# Patient Record
Sex: Female | Born: 1975 | Race: Black or African American | Hispanic: No | Marital: Single | Smoking: Former smoker
Health system: Southern US, Community
[De-identification: ages and names within clinical notes are randomized; demographics above are authoritative.]

## PROBLEM LIST (undated history)

## (undated) ENCOUNTER — Inpatient Hospital Stay (HOSPITAL_COMMUNITY): Payer: Self-pay

## (undated) DIAGNOSIS — R51 Headache: Secondary | ICD-10-CM

## (undated) DIAGNOSIS — F319 Bipolar disorder, unspecified: Secondary | ICD-10-CM

## (undated) DIAGNOSIS — M1990 Unspecified osteoarthritis, unspecified site: Secondary | ICD-10-CM

## (undated) DIAGNOSIS — H209 Unspecified iridocyclitis: Secondary | ICD-10-CM

## (undated) DIAGNOSIS — F419 Anxiety disorder, unspecified: Secondary | ICD-10-CM

## (undated) DIAGNOSIS — J45909 Unspecified asthma, uncomplicated: Secondary | ICD-10-CM

## (undated) DIAGNOSIS — I10 Essential (primary) hypertension: Secondary | ICD-10-CM

## (undated) DIAGNOSIS — F329 Major depressive disorder, single episode, unspecified: Secondary | ICD-10-CM

## (undated) DIAGNOSIS — D649 Anemia, unspecified: Secondary | ICD-10-CM

## (undated) DIAGNOSIS — E079 Disorder of thyroid, unspecified: Secondary | ICD-10-CM

## (undated) DIAGNOSIS — F32A Depression, unspecified: Secondary | ICD-10-CM

## (undated) HISTORY — DX: Depression, unspecified: F32.A

## (undated) HISTORY — DX: Major depressive disorder, single episode, unspecified: F32.9

## (undated) HISTORY — DX: Essential (primary) hypertension: I10

## (undated) HISTORY — DX: Unspecified osteoarthritis, unspecified site: M19.90

## (undated) HISTORY — PX: OTHER SURGICAL HISTORY: SHX169

## (undated) HISTORY — DX: Anxiety disorder, unspecified: F41.9

## (undated) HISTORY — PX: FRACTURE SURGERY: SHX138

## (undated) HISTORY — PX: ELBOW SURGERY: SHX618

---

## 1998-07-17 ENCOUNTER — Other Ambulatory Visit: Admission: RE | Admit: 1998-07-17 | Discharge: 1998-07-17 | Payer: Self-pay | Admitting: *Deleted

## 1998-12-07 ENCOUNTER — Inpatient Hospital Stay (HOSPITAL_COMMUNITY): Admission: AD | Admit: 1998-12-07 | Discharge: 1998-12-07 | Payer: Self-pay | Admitting: *Deleted

## 1998-12-10 ENCOUNTER — Observation Stay (HOSPITAL_COMMUNITY): Admission: AD | Admit: 1998-12-10 | Discharge: 1998-12-10 | Payer: Self-pay | Admitting: *Deleted

## 1999-01-04 ENCOUNTER — Other Ambulatory Visit: Admission: RE | Admit: 1999-01-04 | Discharge: 1999-01-04 | Payer: Self-pay | Admitting: *Deleted

## 1999-02-11 ENCOUNTER — Emergency Department (HOSPITAL_COMMUNITY): Admission: EM | Admit: 1999-02-11 | Discharge: 1999-02-11 | Payer: Self-pay | Admitting: Emergency Medicine

## 1999-09-22 ENCOUNTER — Emergency Department (HOSPITAL_COMMUNITY): Admission: EM | Admit: 1999-09-22 | Discharge: 1999-09-22 | Payer: Self-pay | Admitting: *Deleted

## 1999-11-20 ENCOUNTER — Emergency Department (HOSPITAL_COMMUNITY): Admission: EM | Admit: 1999-11-20 | Discharge: 1999-11-20 | Payer: Self-pay | Admitting: Emergency Medicine

## 1999-11-21 ENCOUNTER — Emergency Department (HOSPITAL_COMMUNITY): Admission: EM | Admit: 1999-11-21 | Discharge: 1999-11-21 | Payer: Self-pay | Admitting: Emergency Medicine

## 2000-09-15 ENCOUNTER — Emergency Department (HOSPITAL_COMMUNITY): Admission: EM | Admit: 2000-09-15 | Discharge: 2000-09-15 | Payer: Self-pay | Admitting: Internal Medicine

## 2000-09-18 ENCOUNTER — Emergency Department (HOSPITAL_COMMUNITY): Admission: EM | Admit: 2000-09-18 | Discharge: 2000-09-18 | Payer: Self-pay | Admitting: Emergency Medicine

## 2000-11-27 ENCOUNTER — Emergency Department (HOSPITAL_COMMUNITY): Admission: EM | Admit: 2000-11-27 | Discharge: 2000-11-27 | Payer: Self-pay | Admitting: Emergency Medicine

## 2001-01-29 ENCOUNTER — Emergency Department (HOSPITAL_COMMUNITY): Admission: EM | Admit: 2001-01-29 | Discharge: 2001-01-29 | Payer: Self-pay | Admitting: Emergency Medicine

## 2001-01-30 ENCOUNTER — Emergency Department (HOSPITAL_COMMUNITY): Admission: EM | Admit: 2001-01-30 | Discharge: 2001-01-30 | Payer: Self-pay | Admitting: Emergency Medicine

## 2001-01-30 ENCOUNTER — Encounter: Payer: Self-pay | Admitting: Emergency Medicine

## 2001-02-01 ENCOUNTER — Emergency Department (HOSPITAL_COMMUNITY): Admission: EM | Admit: 2001-02-01 | Discharge: 2001-02-01 | Payer: Self-pay | Admitting: Emergency Medicine

## 2001-04-10 ENCOUNTER — Emergency Department (HOSPITAL_COMMUNITY): Admission: EM | Admit: 2001-04-10 | Discharge: 2001-04-10 | Payer: Self-pay | Admitting: Emergency Medicine

## 2001-08-24 ENCOUNTER — Emergency Department (HOSPITAL_COMMUNITY): Admission: EM | Admit: 2001-08-24 | Discharge: 2001-08-24 | Payer: Self-pay | Admitting: Emergency Medicine

## 2001-10-24 ENCOUNTER — Emergency Department (HOSPITAL_COMMUNITY): Admission: EM | Admit: 2001-10-24 | Discharge: 2001-10-24 | Payer: Self-pay | Admitting: Emergency Medicine

## 2002-05-08 ENCOUNTER — Emergency Department (HOSPITAL_COMMUNITY): Admission: EM | Admit: 2002-05-08 | Discharge: 2002-05-08 | Payer: Self-pay | Admitting: Emergency Medicine

## 2002-06-01 ENCOUNTER — Emergency Department (HOSPITAL_COMMUNITY): Admission: EM | Admit: 2002-06-01 | Discharge: 2002-06-01 | Payer: Self-pay | Admitting: Emergency Medicine

## 2002-06-01 ENCOUNTER — Encounter: Payer: Self-pay | Admitting: Emergency Medicine

## 2003-01-30 ENCOUNTER — Other Ambulatory Visit: Admission: RE | Admit: 2003-01-30 | Discharge: 2003-01-30 | Payer: Self-pay | Admitting: *Deleted

## 2003-02-17 ENCOUNTER — Encounter: Payer: Self-pay | Admitting: Emergency Medicine

## 2003-02-17 ENCOUNTER — Emergency Department (HOSPITAL_COMMUNITY): Admission: EM | Admit: 2003-02-17 | Discharge: 2003-02-17 | Payer: Self-pay | Admitting: Emergency Medicine

## 2003-08-11 ENCOUNTER — Emergency Department (HOSPITAL_COMMUNITY): Admission: EM | Admit: 2003-08-11 | Discharge: 2003-08-11 | Payer: Self-pay | Admitting: Emergency Medicine

## 2003-12-08 ENCOUNTER — Other Ambulatory Visit: Admission: RE | Admit: 2003-12-08 | Discharge: 2003-12-08 | Payer: Self-pay | Admitting: Obstetrics and Gynecology

## 2003-12-26 ENCOUNTER — Emergency Department (HOSPITAL_COMMUNITY): Admission: EM | Admit: 2003-12-26 | Discharge: 2003-12-27 | Payer: Self-pay | Admitting: Emergency Medicine

## 2004-05-09 ENCOUNTER — Emergency Department (HOSPITAL_COMMUNITY): Admission: EM | Admit: 2004-05-09 | Discharge: 2004-05-09 | Payer: Self-pay | Admitting: Emergency Medicine

## 2004-05-13 ENCOUNTER — Inpatient Hospital Stay (HOSPITAL_COMMUNITY): Admission: AD | Admit: 2004-05-13 | Discharge: 2004-05-13 | Payer: Self-pay | Admitting: Obstetrics

## 2004-06-24 ENCOUNTER — Emergency Department (HOSPITAL_COMMUNITY): Admission: EM | Admit: 2004-06-24 | Discharge: 2004-06-24 | Payer: Self-pay | Admitting: Emergency Medicine

## 2004-06-26 ENCOUNTER — Emergency Department (HOSPITAL_COMMUNITY): Admission: EM | Admit: 2004-06-26 | Discharge: 2004-06-26 | Payer: Self-pay | Admitting: Emergency Medicine

## 2004-11-10 ENCOUNTER — Emergency Department (HOSPITAL_COMMUNITY): Admission: EM | Admit: 2004-11-10 | Discharge: 2004-11-10 | Payer: Self-pay | Admitting: Emergency Medicine

## 2005-02-03 ENCOUNTER — Emergency Department (HOSPITAL_COMMUNITY): Admission: EM | Admit: 2005-02-03 | Discharge: 2005-02-03 | Payer: Self-pay | Admitting: Emergency Medicine

## 2005-09-14 ENCOUNTER — Emergency Department (HOSPITAL_COMMUNITY): Admission: EM | Admit: 2005-09-14 | Discharge: 2005-09-14 | Payer: Self-pay | Admitting: Emergency Medicine

## 2005-11-03 ENCOUNTER — Emergency Department (HOSPITAL_COMMUNITY): Admission: EM | Admit: 2005-11-03 | Discharge: 2005-11-03 | Payer: Self-pay | Admitting: Emergency Medicine

## 2005-11-06 ENCOUNTER — Emergency Department (HOSPITAL_COMMUNITY): Admission: EM | Admit: 2005-11-06 | Discharge: 2005-11-06 | Payer: Self-pay | Admitting: Emergency Medicine

## 2006-05-22 ENCOUNTER — Emergency Department (HOSPITAL_COMMUNITY): Admission: EM | Admit: 2006-05-22 | Discharge: 2006-05-22 | Payer: Self-pay | Admitting: Family Medicine

## 2006-07-15 ENCOUNTER — Emergency Department (HOSPITAL_COMMUNITY): Admission: EM | Admit: 2006-07-15 | Discharge: 2006-07-15 | Payer: Self-pay | Admitting: Emergency Medicine

## 2006-08-31 ENCOUNTER — Emergency Department (HOSPITAL_COMMUNITY): Admission: EM | Admit: 2006-08-31 | Discharge: 2006-08-31 | Payer: Self-pay | Admitting: Emergency Medicine

## 2006-09-14 ENCOUNTER — Other Ambulatory Visit (HOSPITAL_COMMUNITY): Admission: RE | Admit: 2006-09-14 | Discharge: 2006-12-13 | Payer: Self-pay | Admitting: Psychiatry

## 2006-09-14 ENCOUNTER — Ambulatory Visit: Payer: Self-pay | Admitting: Psychiatry

## 2006-12-24 ENCOUNTER — Emergency Department (HOSPITAL_COMMUNITY): Admission: EM | Admit: 2006-12-24 | Discharge: 2006-12-24 | Payer: Self-pay | Admitting: Family Medicine

## 2007-01-19 ENCOUNTER — Ambulatory Visit: Payer: Self-pay | Admitting: Psychiatry

## 2007-01-19 ENCOUNTER — Inpatient Hospital Stay (HOSPITAL_COMMUNITY): Admission: AD | Admit: 2007-01-19 | Discharge: 2007-01-22 | Payer: Self-pay | Admitting: Psychiatry

## 2007-01-27 ENCOUNTER — Encounter: Admission: RE | Admit: 2007-01-27 | Discharge: 2007-01-27 | Payer: Self-pay | Admitting: Obstetrics and Gynecology

## 2007-07-08 ENCOUNTER — Emergency Department (HOSPITAL_COMMUNITY): Admission: EM | Admit: 2007-07-08 | Discharge: 2007-07-08 | Payer: Self-pay | Admitting: Emergency Medicine

## 2008-04-26 ENCOUNTER — Emergency Department (HOSPITAL_COMMUNITY): Admission: EM | Admit: 2008-04-26 | Discharge: 2008-04-26 | Payer: Self-pay | Admitting: Emergency Medicine

## 2008-07-16 ENCOUNTER — Emergency Department (HOSPITAL_COMMUNITY): Admission: EM | Admit: 2008-07-16 | Discharge: 2008-07-16 | Payer: Self-pay | Admitting: Emergency Medicine

## 2008-08-25 ENCOUNTER — Emergency Department (HOSPITAL_COMMUNITY): Admission: EM | Admit: 2008-08-25 | Discharge: 2008-08-25 | Payer: Self-pay | Admitting: Emergency Medicine

## 2008-10-28 ENCOUNTER — Emergency Department (HOSPITAL_COMMUNITY): Admission: EM | Admit: 2008-10-28 | Discharge: 2008-10-28 | Payer: Self-pay | Admitting: Emergency Medicine

## 2008-11-21 ENCOUNTER — Emergency Department (HOSPITAL_COMMUNITY): Admission: EM | Admit: 2008-11-21 | Discharge: 2008-11-21 | Payer: Self-pay | Admitting: Emergency Medicine

## 2008-12-11 ENCOUNTER — Encounter: Admission: RE | Admit: 2008-12-11 | Discharge: 2008-12-11 | Payer: Self-pay | Admitting: Family Medicine

## 2009-02-21 ENCOUNTER — Emergency Department (HOSPITAL_COMMUNITY): Admission: EM | Admit: 2009-02-21 | Discharge: 2009-02-21 | Payer: Self-pay | Admitting: Emergency Medicine

## 2010-10-24 ENCOUNTER — Emergency Department (HOSPITAL_COMMUNITY)
Admission: EM | Admit: 2010-10-24 | Discharge: 2010-10-24 | Payer: Self-pay | Source: Home / Self Care | Admitting: Emergency Medicine

## 2011-03-21 NOTE — Discharge Summary (Signed)
Patricia Arnold NO.:  [id number]   MEDICAL RECORD NO.:  [id number]          PATIENT TYPE:  IPS   LOCATION:  0303                          FACILITY:  [facility]   PHYSICIAN:  Anselm Jungling, MD  DATE OF BIRTH:  January 10, 1976   DATE OF ADMISSION:  01/19/2007  DATE OF DISCHARGE:  01/22/2007                               DISCHARGE SUMMARY   IDENTIFYING DATA AND REASON FOR ADMISSION:  This was an inpatient  psychiatric admission for Patricia Arnold, a 35 year old single African American  female, employed, and a single mother of one.  She was admitted for  depressive symptoms and suicidal thoughts and wanting to kill myself.  She described her primary stressor as being her parents deteriorating  marriage, and the patient being put in the middle of her parents  conflicts.  She did not have any prior history of suicide attempts, and  upon admission denied any plan or intent for suicide.  She had not been  involved in any mental health treatment.  She had no history of drug or  alcohol abuse.  Her sleeping and eating had been diminished.  Please  refer to the admission note for further details pertaining to the  symptoms, circumstances and history that led to her hospitalization.  She was given an initial Axis I diagnosis of major depressive episode  versus adjustment disorder with depressed mood.   MEDICAL AND LABORATORY:  The patient was medically and physically  assessed by the psychiatric nurse practitioner.  She did not have any  active or chronic medical problems.   HOSPITAL COURSE:  The patient was admitted to the adult inpatient  psychiatric service.  She presented as a well-nourished, well-developed  female who was alert, fully oriented, and intelligent.  Her mood was  depressed with sad affect.  There were no signs or symptoms of psychosis  or thought disorder.  She convincingly denied any suicidal ideation or  intent or plan, and verbalized a strong desire for  help.   She agreed to a trial of Lexapro 10 mg daily, and this was well  tolerated, except for some mild initial nausea and periods of feeling  hot.   She participated in various therapeutic groups and activities and was a  good participant in the treatment program.   On the fourth hospital day there was a family session involving the  patient, her mother and sister.  In that meeting she stated again that  she was no longer suicidal.  They discussed a lot of the difficulties  that Patricia Arnold had been experiencing within the family.  They discussed  aftercare needs including psychiatrist for medication management and  individual counseling.  Her family members were very supportive.  The  patient was discharged following this meeting.  The patient agreed to  the following aftercare plan.   AFTERCARE:  The patient was discharged with a plan to follow-up with Mr.  Arbutus Ped on 01/25/2007, the day of discharge for individual  counseling.  She was also referred for management of her response to  Lexapro 10 mg daily.   The patient identified a small firm  lump in the surface skin of one of  her breasts, and was referred to the [facility] in  [place], for an appointment on 01/27/07 at 2:30 p.m.   DISCHARGE DIAGNOSES:  AXIS I: Adjustment disorder with depressed mood,  and rule out major depressive episode.  AXIS II: Deferred.  AXIS III: Breast lump, unknown etiology.  AXIS IV: Stressors severe.  AXIS V: GAF on discharge 65.      Anselm Jungling, MD  Electronically Signed     SPB/MEDQ  D:  01/25/2007  T:  01/25/2007  Job:  [id number]

## 2011-04-11 ENCOUNTER — Emergency Department (HOSPITAL_COMMUNITY): Payer: BC Managed Care – PPO

## 2011-04-11 ENCOUNTER — Emergency Department (HOSPITAL_COMMUNITY)
Admission: EM | Admit: 2011-04-11 | Discharge: 2011-04-11 | Disposition: A | Discharge status: Home / Self Care | Payer: BC Managed Care – PPO | Attending: Emergency Medicine | Admitting: Emergency Medicine

## 2011-04-11 DIAGNOSIS — D573 Sickle-cell trait: Secondary | ICD-10-CM | POA: Insufficient documentation

## 2011-04-11 DIAGNOSIS — W1809XA Striking against other object with subsequent fall, initial encounter: Secondary | ICD-10-CM | POA: Insufficient documentation

## 2011-04-11 DIAGNOSIS — R51 Headache: Secondary | ICD-10-CM | POA: Insufficient documentation

## 2011-04-11 LAB — POCT PREGNANCY, URINE: Preg Test, Ur: NEGATIVE

## 2011-08-01 ENCOUNTER — Emergency Department (HOSPITAL_COMMUNITY)
Admission: EM | Admit: 2011-08-01 | Discharge: 2011-08-01 | Disposition: A | Discharge status: Home / Self Care | Payer: BC Managed Care – PPO | Attending: Emergency Medicine | Admitting: Emergency Medicine

## 2011-08-01 DIAGNOSIS — D573 Sickle-cell trait: Secondary | ICD-10-CM | POA: Insufficient documentation

## 2011-08-01 DIAGNOSIS — R110 Nausea: Secondary | ICD-10-CM | POA: Insufficient documentation

## 2011-08-01 DIAGNOSIS — R209 Unspecified disturbances of skin sensation: Secondary | ICD-10-CM | POA: Insufficient documentation

## 2011-08-01 DIAGNOSIS — R51 Headache: Secondary | ICD-10-CM | POA: Insufficient documentation

## 2011-08-23 ENCOUNTER — Emergency Department (HOSPITAL_COMMUNITY)
Admission: EM | Admit: 2011-08-23 | Discharge: 2011-08-23 | Disposition: A | Discharge status: Home / Self Care | Payer: BC Managed Care – PPO | Attending: Emergency Medicine | Admitting: Emergency Medicine

## 2011-08-23 DIAGNOSIS — H53149 Visual discomfort, unspecified: Secondary | ICD-10-CM | POA: Insufficient documentation

## 2011-08-23 DIAGNOSIS — Z79899 Other long term (current) drug therapy: Secondary | ICD-10-CM | POA: Insufficient documentation

## 2011-08-23 DIAGNOSIS — T481X5A Adverse effect of skeletal muscle relaxants [neuromuscular blocking agents], initial encounter: Secondary | ICD-10-CM | POA: Insufficient documentation

## 2011-08-23 DIAGNOSIS — R51 Headache: Secondary | ICD-10-CM | POA: Insufficient documentation

## 2011-08-23 DIAGNOSIS — F411 Generalized anxiety disorder: Secondary | ICD-10-CM | POA: Insufficient documentation

## 2011-08-23 DIAGNOSIS — D573 Sickle-cell trait: Secondary | ICD-10-CM | POA: Insufficient documentation

## 2011-08-23 DIAGNOSIS — L299 Pruritus, unspecified: Secondary | ICD-10-CM | POA: Insufficient documentation

## 2011-08-23 DIAGNOSIS — R209 Unspecified disturbances of skin sensation: Secondary | ICD-10-CM | POA: Insufficient documentation

## 2011-08-27 ENCOUNTER — Emergency Department (HOSPITAL_COMMUNITY): Payer: BC Managed Care – PPO

## 2011-08-27 ENCOUNTER — Emergency Department (HOSPITAL_COMMUNITY)
Admission: EM | Admit: 2011-08-27 | Discharge: 2011-08-27 | Disposition: A | Discharge status: Home / Self Care | Payer: BC Managed Care – PPO | Attending: Emergency Medicine | Admitting: Emergency Medicine

## 2011-08-27 DIAGNOSIS — Z79899 Other long term (current) drug therapy: Secondary | ICD-10-CM | POA: Insufficient documentation

## 2011-08-27 DIAGNOSIS — R0989 Other specified symptoms and signs involving the circulatory and respiratory systems: Secondary | ICD-10-CM | POA: Insufficient documentation

## 2011-08-27 DIAGNOSIS — L851 Acquired keratosis [keratoderma] palmaris et plantaris: Secondary | ICD-10-CM | POA: Insufficient documentation

## 2011-08-27 DIAGNOSIS — I10 Essential (primary) hypertension: Secondary | ICD-10-CM | POA: Insufficient documentation

## 2011-08-27 DIAGNOSIS — R0609 Other forms of dyspnea: Secondary | ICD-10-CM | POA: Insufficient documentation

## 2011-08-27 DIAGNOSIS — R0789 Other chest pain: Secondary | ICD-10-CM | POA: Insufficient documentation

## 2011-08-27 DIAGNOSIS — L298 Other pruritus: Secondary | ICD-10-CM | POA: Insufficient documentation

## 2011-08-27 DIAGNOSIS — L2989 Other pruritus: Secondary | ICD-10-CM | POA: Insufficient documentation

## 2011-08-27 LAB — CBC
MCH: 30.2 pg (ref 26.0–34.0)
MCHC: 36.1 g/dL — ABNORMAL HIGH (ref 30.0–36.0)
Platelets: 264 10*3/uL (ref 150–400)
RDW: 12.2 % (ref 11.5–15.5)

## 2011-08-27 LAB — BASIC METABOLIC PANEL
BUN: 11 mg/dL (ref 6–23)
Calcium: 9.7 mg/dL (ref 8.4–10.5)
Creatinine, Ser: 0.63 mg/dL (ref 0.50–1.10)
GFR calc Af Amer: >90 mL/min (ref >90)
GFR calc non Af Amer: >90 mL/min (ref >90)

## 2011-08-27 LAB — POCT I-STAT TROPONIN I: Troponin i, poc: 0.01 ng/mL (ref 0.00–0.08)

## 2011-08-27 LAB — DIFFERENTIAL
Basophils Relative: 0 % (ref 0–1)
Eosinophils Absolute: 0 10*3/uL (ref 0.0–0.7)
Monocytes Absolute: 0.9 10*3/uL (ref 0.1–1.0)
Monocytes Relative: 6 % (ref 3–12)

## 2012-03-08 ENCOUNTER — Emergency Department (HOSPITAL_COMMUNITY)
Admission: EM | Admit: 2012-03-08 | Discharge: 2012-03-08 | Disposition: A | Discharge status: Home / Self Care | Payer: BC Managed Care – PPO | Attending: Emergency Medicine | Admitting: Emergency Medicine

## 2012-03-08 ENCOUNTER — Encounter (HOSPITAL_COMMUNITY): Payer: Self-pay | Admitting: Emergency Medicine

## 2012-03-08 DIAGNOSIS — R6889 Other general symptoms and signs: Secondary | ICD-10-CM | POA: Insufficient documentation

## 2012-03-08 DIAGNOSIS — R070 Pain in throat: Secondary | ICD-10-CM | POA: Insufficient documentation

## 2012-03-08 DIAGNOSIS — J3489 Other specified disorders of nose and nasal sinuses: Secondary | ICD-10-CM | POA: Insufficient documentation

## 2012-03-08 DIAGNOSIS — H9209 Otalgia, unspecified ear: Secondary | ICD-10-CM | POA: Insufficient documentation

## 2012-03-08 DIAGNOSIS — J069 Acute upper respiratory infection, unspecified: Secondary | ICD-10-CM | POA: Insufficient documentation

## 2012-03-08 HISTORY — DX: Anemia, unspecified: D64.9

## 2012-03-08 MED ORDER — MAGIC MOUTHWASH W/LIDOCAINE
5.0000 mL | ORAL | Status: AC
  Administered 2012-03-08: 5 mL via ORAL
  Filled 2012-03-08: qty 5

## 2012-03-08 MED ORDER — MAGIC MOUTHWASH W/LIDOCAINE: 5.0000 mL | Freq: Three times a day (TID) | ORAL | Status: DC | PRN

## 2012-03-08 NOTE — Discharge Instructions (Signed)

## 2012-03-08 NOTE — ED Notes (Signed)
Sore throat woke up this am left ear swollen migrain and back pain

## 2012-03-08 NOTE — ED Provider Notes (Signed)
History     CSN: [id number]  Arrival date & time 03/08/12  0918   First MD Initiated Contact with Patient 03/08/12 [phone]      Chief Complaint  Patient presents with  . Sore Throat    (Consider location/radiation/quality/duration/timing/severity/associated sxs/prior treatment) HPI  36 year old female presents to the ER with cold symptoms. Patient states she woke up this morning experiencing sneezing, runny nose, sore throat, pain to her left ear is sensitive to the touch. States for sore throat bothers her the most.  Patient also has persistent sinus affecting primary to the left side. She denies light and sound sensitivity. Patient denies fever, chills, chest pain, shortness of breath, abdominal pain, nausea, vomiting, diarrhea, or rash. She denies any recent sick contact. She denies any change in her voice. Patient has not tried to alleviate symptoms.  Past Medical History  Diagnosis Date  . Anemia     No past surgical history on file.  No family history on file.  History  Substance Use Topics  . Smoking status: Never Smoker   . Smokeless tobacco: Not on file  . Alcohol Use: No    OB History    Grav Para Term Preterm Abortions TAB SAB Ect Mult Living                  Review of Systems  All other systems reviewed and are negative.    Allergies  Penicillins; Sulfa antibiotics; Tizanidine; and Tramadol  Home Medications   Current Outpatient Rx  Name Route Sig Dispense Refill  . PREDNISOLONE ACETATE 1 % OP SUSP Both Eyes Place 1 drop into both eyes every other day.      BP 103/64  Pulse 80  Temp(Src) 98.7 F (37.1 C) (Oral)  Resp 18  SpO2 98%  Physical Exam  Nursing note and vitals reviewed. Constitutional: She appears well-developed and well-nourished. No distress.       Awake, alert, nontoxic appearance  HENT:  Head: Atraumatic.  Right Ear: Hearing, tympanic membrane and external ear normal.  Left Ear: Hearing, tympanic membrane and external ear  normal.  Nose: Rhinorrhea present. No nasal deformity.  Mouth/Throat: Uvula is midline and mucous membranes are normal. No oropharyngeal exudate, posterior oropharyngeal edema, posterior oropharyngeal erythema or tonsillar abscesses.  Eyes: Conjunctivae are normal. Right eye exhibits no discharge. Left eye exhibits no discharge.  Neck: Neck supple.  Cardiovascular: Normal rate and regular rhythm.   Pulmonary/Chest: Effort normal. No respiratory distress. She exhibits no tenderness.  Abdominal: Soft. There is no tenderness. There is no rebound.  Musculoskeletal: She exhibits no tenderness.       ROM appears intact  Neurological:       Mental status and motor strength appears intact  Skin: No rash noted.  Psychiatric: She has a normal mood and affect.    ED Course  Procedures (including critical care time)  Labs Reviewed - No data to display No results found.   No diagnosis found.  Results for orders placed during the hospital encounter of 03/08/12  RAPID STREP SCREEN      Component Value Range   Streptococcus, Group A Screen (Direct) NEGATIVE  NEGATIVE    No results found.    MDM  Patient in with cold symptoms suggestive of URI. Tenderness to L ear on palpation without evidence of infection.  Doubt otitis externa.  Throat exam unremarkable. No evidence of PTA or ludwig's angina.  No voice changes, no trismus.  Pt is afebrile, VSS.  Rapid strep  test ordered.  Magic mouthwash given.     10:39 AM Strep test negative.  Will d/c with magic mouthwash for sxs treatment.  Pt voice understanding and agrees with plan.      Fayrene Helper, PA-C 03/08/12 1039

## 2012-03-10 NOTE — ED Provider Notes (Signed)
Medical screening examination/treatment/procedure(s) were performed by non-physician practitioner and as supervising physician I was immediately available for consultation/collaboration.    Aldonia Keeven L Ayari Liwanag, MD 03/10/12 0720 

## 2012-06-10 ENCOUNTER — Encounter (HOSPITAL_COMMUNITY): Payer: Self-pay

## 2012-06-10 ENCOUNTER — Inpatient Hospital Stay (HOSPITAL_COMMUNITY)
Admission: AD | Admit: 2012-06-10 | Discharge: 2012-06-10 | Disposition: A | Discharge status: Home / Self Care | Payer: BC Managed Care – PPO | Source: Ambulatory Visit | Attending: Obstetrics & Gynecology | Admitting: Obstetrics & Gynecology

## 2012-06-10 DIAGNOSIS — R109 Unspecified abdominal pain: Secondary | ICD-10-CM | POA: Insufficient documentation

## 2012-06-10 DIAGNOSIS — O210 Mild hyperemesis gravidarum: Secondary | ICD-10-CM | POA: Insufficient documentation

## 2012-06-10 DIAGNOSIS — O219 Vomiting of pregnancy, unspecified: Secondary | ICD-10-CM

## 2012-06-10 DIAGNOSIS — O30009 Twin pregnancy, unspecified number of placenta and unspecified number of amniotic sacs, unspecified trimester: Secondary | ICD-10-CM | POA: Insufficient documentation

## 2012-06-10 DIAGNOSIS — IMO0002 Reserved for concepts with insufficient information to code with codable children: Secondary | ICD-10-CM | POA: Insufficient documentation

## 2012-06-10 MED ORDER — ONDANSETRON 8 MG PO TBDP
8.0000 mg | ORAL_TABLET | Freq: Once | ORAL | Status: AC
  Administered 2012-06-10: 8 mg via ORAL
  Filled 2012-06-10: qty 1

## 2012-06-10 MED ORDER — ONDANSETRON HCL 8 MG PO TABS: 8.0000 mg | ORAL_TABLET | Freq: Three times a day (TID) | ORAL | Status: AC | PRN

## 2012-06-10 NOTE — MAU Note (Signed)
Patient gets her care in [facility].

## 2012-06-10 NOTE — MAU Note (Signed)
Patient states she has been diagnosed with twins but has miscarried twin a. Started having back pain, abdominal pain and nausea.

## 2012-06-10 NOTE — MAU Provider Note (Signed)
  History     CSN: [id number]  Arrival date and time: 06/10/12 1240   First Provider Initiated Contact with Patient 06/10/12 1325      Chief Complaint  Patient presents with  . Back Pain  . Abdominal Pain  . Nausea   HPI This is a 36 y.o. female at 6-[redacted]weeks gestation who presents with cramping, low back pain and nausea. She has a twin gestation with demise of Twin A several weeks ago. No bleeding but has mucous discharge. Gets PNC in [place] but lives here. States has medical problems that are treated at [facility], so that is why she gets [facility] there instead of here.   OB History    Grav Para Term Preterm Abortions TAB SAB Ect Mult Living   2 1 1  0 0 0 0 0 0 1      Past Medical History  Diagnosis Date  . Anemia     History reviewed. No pertinent past surgical history.  History reviewed. No pertinent family history.  History  Substance Use Topics  . Smoking status: Never Smoker   . Smokeless tobacco: Not on file  . Alcohol Use: No    Allergies:  Allergies  Allergen Reactions  . Penicillins Anaphylaxis  . Sulfa Antibiotics Anaphylaxis  . Tizanidine Anaphylaxis  . Tramadol Anaphylaxis    Prescriptions prior to admission  Medication Sig Dispense Refill  . albuterol (PROVENTIL HFA;VENTOLIN HFA) 108 (90 BASE) MCG/ACT inhaler Inhale 2 puffs into the lungs every 6 (six) hours as needed. Takes for shortness of breath      . prednisoLONE acetate (PRED FORTE) 1 % ophthalmic suspension Place 1 drop into both eyes every other day.      . Prenatal Vit-Fe Fumarate-FA (PRENATAL MULTIVITAMIN) TABS Take 1 tablet by mouth every morning.        ROS As in HPI  Physical Exam   Blood pressure 112/72, pulse 90, temperature 98.2 F (36.8 C), temperature source Oral, resp. rate 16, height 5\' 2"  (1.575 m), weight 118 lb (53.524 kg), last menstrual period 04/16/2012, SpO2 100.00%.  Physical Exam  Constitutional: She is oriented to person, place, and time. She appears well-developed and  well-nourished. No distress.  Cardiovascular: Normal rate.   Respiratory: Effort normal.  GI: Soft. She exhibits no distension and no mass. There is no tenderness. There is no rebound and no guarding.  Genitourinary: Uterus normal. Vaginal discharge found.       Cervix long and closed.  Bedside US shows Viable live IUP with FHR 160 and adjacent IUGS with no fetus visible.  Neurological: She is alert and oriented to person, place, and time.  Skin: Skin is warm and dry.  Psychiatric: She has a normal mood and affect.    MAU Course  Procedures  Assessment and Plan  A:  Twin IUP with demise of Twin A       Uterine cramping with no cervical change       Nausea and vomiting of pregnancy, history of hyperemesis  P:  Discharge home       Reassured no miscarriage of Twin B      Rx Zofran for nausea      Follow up at her office  [facility] 06/10/2012, 1:59 PM

## 2012-06-21 ENCOUNTER — Encounter (HOSPITAL_COMMUNITY): Payer: Self-pay

## 2012-06-21 ENCOUNTER — Inpatient Hospital Stay (HOSPITAL_COMMUNITY)
Admission: AD | Admit: 2012-06-21 | Discharge: 2012-06-21 | Disposition: A | Discharge status: Home / Self Care | Payer: BC Managed Care – PPO | Source: Ambulatory Visit | Attending: Obstetrics & Gynecology | Admitting: Obstetrics & Gynecology

## 2012-06-21 DIAGNOSIS — B9689 Other specified bacterial agents as the cause of diseases classified elsewhere: Secondary | ICD-10-CM | POA: Diagnosis present

## 2012-06-21 DIAGNOSIS — O2390 Unspecified genitourinary tract infection in pregnancy, unspecified trimester: Secondary | ICD-10-CM | POA: Insufficient documentation

## 2012-06-21 DIAGNOSIS — O210 Mild hyperemesis gravidarum: Secondary | ICD-10-CM | POA: Insufficient documentation

## 2012-06-21 DIAGNOSIS — O99891 Other specified diseases and conditions complicating pregnancy: Secondary | ICD-10-CM | POA: Insufficient documentation

## 2012-06-21 DIAGNOSIS — A084 Viral intestinal infection, unspecified: Secondary | ICD-10-CM | POA: Diagnosis present

## 2012-06-21 DIAGNOSIS — N760 Acute vaginitis: Secondary | ICD-10-CM | POA: Insufficient documentation

## 2012-06-21 DIAGNOSIS — A499 Bacterial infection, unspecified: Secondary | ICD-10-CM | POA: Insufficient documentation

## 2012-06-21 DIAGNOSIS — R109 Unspecified abdominal pain: Secondary | ICD-10-CM | POA: Insufficient documentation

## 2012-06-21 DIAGNOSIS — A088 Other specified intestinal infections: Secondary | ICD-10-CM | POA: Insufficient documentation

## 2012-06-21 DIAGNOSIS — O219 Vomiting of pregnancy, unspecified: Secondary | ICD-10-CM | POA: Diagnosis present

## 2012-06-21 HISTORY — DX: Unspecified iridocyclitis: H20.9

## 2012-06-21 HISTORY — DX: Headache: R51

## 2012-06-21 LAB — CBC WITH DIFFERENTIAL/PLATELET
Eosinophils Absolute: 0.2 10*3/uL (ref 0.0–0.7)
Eosinophils Relative: 1 % (ref 0–5)
Lymphs Abs: 3.1 10*3/uL (ref 0.7–4.0)
MCH: 29.2 pg (ref 26.0–34.0)
MCV: 85 fL (ref 78.0–100.0)
Platelets: 247 10*3/uL (ref 150–400)
RBC: 3.67 MIL/uL — ABNORMAL LOW (ref 3.87–5.11)

## 2012-06-21 LAB — URINE MICROSCOPIC-ADD ON

## 2012-06-21 LAB — URINALYSIS, ROUTINE W REFLEX MICROSCOPIC
Bilirubin Urine: NEGATIVE
Hgb urine dipstick: NEGATIVE
Ketones, ur: NEGATIVE mg/dL
Protein, ur: NEGATIVE mg/dL
Specific Gravity, Urine: 1.015 (ref 1.005–1.030)
Urobilinogen, UA: 0.2 mg/dL (ref 0.0–1.0)

## 2012-06-21 LAB — WET PREP, GENITAL: Yeast Wet Prep HPF POC: NONE SEEN

## 2012-06-21 MED ORDER — ONDANSETRON HCL 4 MG/2ML IJ SOLN
4.0000 mg | INTRAMUSCULAR | Status: AC
  Administered 2012-06-21: 4 mg via INTRAVENOUS
  Filled 2012-06-21: qty 2

## 2012-06-21 MED ORDER — ONDANSETRON HCL 8 MG PO TABS: 4.0000 mg | ORAL_TABLET | Freq: Three times a day (TID) | ORAL | Status: DC | PRN

## 2012-06-21 MED ORDER — PROMETHAZINE HCL 25 MG PO TABS: 12.5000 mg | ORAL_TABLET | Freq: Four times a day (QID) | ORAL | Status: DC | PRN

## 2012-06-21 MED ORDER — LACTATED RINGERS IV BOLUS (SEPSIS)
1000.0000 mL | Freq: Once | INTRAVENOUS | Status: AC
  Administered 2012-06-21: 1000 mL via INTRAVENOUS

## 2012-06-21 NOTE — MAU Provider Note (Signed)
History     CSN: [id number]  Arrival date and time: 06/21/12 1709   First Provider Initiated Contact with Patient 06/21/12 2020      Chief Complaint  Patient presents with  . Fever  . Vaginal Discharge  . Abdominal Pain   HPI  Pt is [redacted]w[redacted]d pregnant and presents with fever and vaginal discharge.  She has been running a fever for a week.  She said her temperature of 102.  She gets her OB care in [facility].  Today her fever was 102; she did not have a fever since 8/13.  Pt has not been nauseated or vomiting.  She had 4 solid bowel movements today, when she usually has one daily..  She abdominal pain in her left lower quadrant and shoots around to her back.  Her vaginal discharge is like snot.  She was told she had a twin pregnancy with confirmation that 2nd baby was passed.    Past Medical History  Diagnosis Date  . Anemia   . Headache   . Uveitis     Past Surgical History  Procedure Date  . Reconstrictive   . Elbow surgery     Family History  Problem Relation Age of Onset  . Other Neg Hx     History  Substance Use Topics  . Smoking status: Never Smoker   . Smokeless tobacco: Not on file  . Alcohol Use: No    Allergies:  Allergies  Allergen Reactions  . Penicillins Anaphylaxis  . Sulfa Antibiotics Anaphylaxis  . Tizanidine Anaphylaxis  . Tramadol Anaphylaxis    Prescriptions prior to admission  Medication Sig Dispense Refill  . ibuprofen (ADVIL,MOTRIN) 200 MG tablet Take 400 mg by mouth once. For fever      . ondansetron (ZOFRAN) 8 MG tablet Take 8 mg by mouth daily as needed. For nausea      . prednisoLONE acetate (PRED FORTE) 1 % ophthalmic suspension Place 1 drop into both eyes every other day.      . Prenatal Vit-Fe Fumarate-FA (PRENATAL MULTIVITAMIN) TABS Take 1 tablet by mouth every morning.      Marland Kitchen albuterol (PROVENTIL HFA;VENTOLIN HFA) 108 (90 BASE) MCG/ACT inhaler Inhale 2 puffs into the lungs every 6 (six) hours as needed. Takes for  shortness of breath        Review of Systems  Constitutional: Positive for fever and malaise/fatigue. Negative for chills.  HENT: Negative for congestion.   Eyes: Negative for blurred vision.  Gastrointestinal: Positive for abdominal pain. Negative for nausea, vomiting, diarrhea and constipation.  Genitourinary: Negative for dysuria and urgency.  Neurological: Positive for headaches.   Physical Exam   Blood pressure 112/62, pulse 80, temperature 98.8 F (37.1 C), temperature source Oral, resp. rate 16, height 5\' 2"  (1.575 m), weight 55.611 kg (122 lb 9.6 oz), last menstrual period 04/16/2012, SpO2 100.00%.  Physical Exam  Nursing note and vitals reviewed. Constitutional: She is oriented to person, place, and time. She appears well-developed and well-nourished.  Neck: Normal range of motion.  Cardiovascular: Normal rate, regular rhythm and normal heart sounds.   Respiratory: Effort normal.  GI: Soft. She exhibits no distension. There is tenderness. There is no rebound and no guarding.  Musculoskeletal: Normal range of motion.  Neurological: She is alert and oriented to person, place, and time.  Skin: Skin is warm and dry.  Psychiatric: She has a normal mood and affect. Her behavior is normal. Judgment and thought content normal.  Abdominal tenderness  diffuse, on R and L side  Pelvic exam: Cervix pink, visually closed, without lesion, moderate thin gray/white discharge with fishy odor, vaginal walls and external genitalia normal Cervix 0/long/high, uterus nontender, adnexa nontender  CVA tenderness negative   MAU Course  Procedures    Assessment and Plan    LINEBERRY,SUSAN 06/21/2012, 8:20 PM   Care assumed from Pamelia Hoit, NP  Results for orders placed during the hospital encounter of 06/21/12 (from the past 24 hour(s))  URINALYSIS, ROUTINE W REFLEX MICROSCOPIC     Status: Abnormal   Collection Time   06/21/12  6:25 PM      Component Value Range   Color, Urine  YELLOW  YELLOW   APPearance CLEAR  CLEAR   Specific Gravity, Urine 1.015  1.005 - 1.030   pH 7.0  5.0 - 8.0   Glucose, UA NEGATIVE  NEGATIVE mg/dL   Hgb urine dipstick NEGATIVE  NEGATIVE   Bilirubin Urine NEGATIVE  NEGATIVE   Ketones, ur NEGATIVE  NEGATIVE mg/dL   Protein, ur NEGATIVE  NEGATIVE mg/dL   Urobilinogen, UA 0.2  0.0 - 1.0 mg/dL   Nitrite NEGATIVE  NEGATIVE   Leukocytes, UA TRACE (*) NEGATIVE  URINE MICROSCOPIC-ADD ON     Status: Abnormal   Collection Time   06/21/12  6:25 PM      Component Value Range   Squamous Epithelial / LPF FEW (*) RARE   WBC, UA 0-2  <3 WBC/hpf  CBC WITH DIFFERENTIAL     Status: Abnormal   Collection Time   06/21/12  8:01 PM      Component Value Range   WBC 12.0 (*) 4.0 - 10.5 K/uL   RBC 3.67 (*) 3.87 - 5.11 MIL/uL   Hemoglobin 10.7 (*) 12.0 - 15.0 g/dL   HCT 16.1 (*) 09.6 - 04.5 %   MCV 85.0  78.0 - 100.0 fL   MCH 29.2  26.0 - 34.0 pg   MCHC 34.3  30.0 - 36.0 g/dL   RDW 40.9  81.1 - 91.4 %   Platelets 247  150 - 400 K/uL   Neutrophils Relative 63  43 - 77 %   Neutro Abs 7.6  1.7 - 7.7 K/uL   Lymphocytes Relative 26  12 - 46 %   Lymphs Abs 3.1  0.7 - 4.0 K/uL   Monocytes Relative 9  3 - 12 %   Monocytes Absolute 1.1 (*) 0.1 - 1.0 K/uL   Eosinophils Relative 1  0 - 5 %   Eosinophils Absolute 0.2  0.0 - 0.7 K/uL   Basophils Relative 0  0 - 1 %   Basophils Absolute 0.0  0.0 - 0.1 K/uL  WET PREP, GENITAL     Status: Abnormal   Collection Time   06/21/12 10:10 PM      Component Value Range   Yeast Wet Prep HPF POC NONE SEEN  NONE SEEN   Trich, Wet Prep NONE SEEN  NONE SEEN   Clue Cells Wet Prep HPF POC MANY (*) NONE SEEN   WBC, Wet Prep HPF POC MODERATE (*) NONE SEEN   Bedside sono with active fetal movement, FHR, and subjectively normal amniotic fluid level  A: Nausea and vomiting of pregnancy Viral gastroenteritis Bacterial vaginosis  P: LR x1060ml IV plus Zofran 4 mg IV in MAU with pt improvement D/C home Renewed pt Zofran  prescription Phenergan 12.5-25 mg PO Q6 PRN F/U with prenatal provider in [place] Return to MAU as needed  [employer]  Nurse-Midwife

## 2012-06-21 NOTE — MAU Note (Signed)
Patient get her care in [facility].

## 2012-06-21 NOTE — MAU Note (Signed)
Nausea relieved  

## 2012-06-21 NOTE — MAU Note (Signed)
Patient states she has been having a fever as high as 102 today. Has been having abdominal pain since her last visit on 8-8. Patient states she was carrying twins and lost one. Has been having a mucus discharge.

## 2012-06-22 LAB — URINE CULTURE

## 2012-06-22 NOTE — MAU Provider Note (Signed)
Medical Screening exam and patient care preformed by advanced practice provider.  Agree with the above management.  

## 2012-07-11 ENCOUNTER — Emergency Department (HOSPITAL_COMMUNITY)
Admission: EM | Admit: 2012-07-11 | Discharge: 2012-07-11 | Disposition: A | Discharge status: Home / Self Care | Payer: BC Managed Care – PPO | Attending: Emergency Medicine | Admitting: Emergency Medicine

## 2012-07-11 ENCOUNTER — Emergency Department (HOSPITAL_COMMUNITY): Payer: BC Managed Care – PPO

## 2012-07-11 ENCOUNTER — Encounter (HOSPITAL_COMMUNITY): Payer: Self-pay

## 2012-07-11 DIAGNOSIS — S62609A Fracture of unspecified phalanx of unspecified finger, initial encounter for closed fracture: Secondary | ICD-10-CM | POA: Insufficient documentation

## 2012-07-11 DIAGNOSIS — D649 Anemia, unspecified: Secondary | ICD-10-CM | POA: Insufficient documentation

## 2012-07-11 DIAGNOSIS — S62509A Fracture of unspecified phalanx of unspecified thumb, initial encounter for closed fracture: Secondary | ICD-10-CM

## 2012-07-11 DIAGNOSIS — W230XXA Caught, crushed, jammed, or pinched between moving objects, initial encounter: Secondary | ICD-10-CM | POA: Insufficient documentation

## 2012-07-11 DIAGNOSIS — S60019A Contusion of unspecified thumb without damage to nail, initial encounter: Secondary | ICD-10-CM

## 2012-07-11 NOTE — Progress Notes (Signed)
Orthopedic Tech Progress Note Patient Details:  Patricia Arnold 1975-12-24 [id number]  Ortho Devices Type of Ortho Device: Ace wrap;Thumb spica splint Splint Material: Plaster Ortho Device/Splint Location: (R) UE Ortho Device/Splint Interventions: Application   Jennye Moccasin 07/11/2012, 1:32 PM

## 2012-07-11 NOTE — Progress Notes (Signed)
Orthopedic Tech Progress Note Patient Details:  Patricia Arnold 01/02/1976 [id number]  Ortho Devices Type of Ortho Device: Thumb velcro splint Ortho Device/Splint Location: (R) UE Ortho Device/Splint Interventions: Application   Jennye Moccasin 07/11/2012, 1:44 PM

## 2012-07-11 NOTE — ED Provider Notes (Addendum)
History     CSN: [id number]  Arrival date & time 07/11/12  1130   First MD Initiated Contact with Patient 07/11/12 1231      Chief Complaint  Patient presents with  . Arm Injury    (Consider location/radiation/quality/duration/timing/severity/associated sxs/prior treatment) HPI This 36 year old female accidentally slammed her right thumb in a car door just prior to arrival. She did have severe pain radiating up the arm but now she just has pain to the thumb only. She is no weakness or numbness it just hurts to move the thumb. Skin is intact. The rest of her hand wrist and the rest of her right arm are totally nontender not hurting now. She is no head or neck injury no injury to her upper arm or legs or trunk. The pain was severe it is now improving. She does not want pain medicines in the ED. Her pain is still moderately severe to palpation and with movement but is mild to moderately severe throbbing at rest. There is no other injury no other concerns. Past Medical History  Diagnosis Date  . Anemia   . Headache   . Uveitis     Past Surgical History  Procedure Date  . Reconstrictive   . Elbow surgery     Family History  Problem Relation Age of Onset  . Other Neg Hx     History  Substance Use Topics  . Smoking status: Never Smoker   . Smokeless tobacco: Not on file  . Alcohol Use: No    OB History    Grav Para Term Preterm Abortions TAB SAB Ect Mult Living   3 1 1  0 1 0 1 0 0 1      Review of Systems See HPI. Allergies  Penicillins; Sulfa antibiotics; Tizanidine; and Tramadol  Home Medications   Current Outpatient Rx  Name Route Sig Dispense Refill  . ONDANSETRON HCL 8 MG PO TABS Oral Take 8 mg by mouth every 8 (eight) hours as needed. For nausea    . PREDNISOLONE ACETATE 1 % OP SUSP Both Eyes Place 1 drop into both eyes every other day.    Marland Kitchen PROMETHAZINE HCL 25 MG PO TABS Oral Take 25 mg by mouth every 6 (six) hours as needed. For nausea      BP 119/64   Pulse 101  Temp 98.3 F (36.8 C) (Oral)  Resp 17  SpO2 97%  LMP 04/12/2012  Breastfeeding? Unknown  Physical Exam  Nursing note and vitals reviewed. Constitutional:       Awake, alert, nontoxic appearance.  HENT:  Head: Atraumatic.  Eyes: Right eye exhibits no discharge. Left eye exhibits no discharge.  Neck: Neck supple.  Pulmonary/Chest: Effort normal. She exhibits no tenderness.  Abdominal: Soft. There is no tenderness. There is no rebound.  Musculoskeletal: She exhibits tenderness.       Baseline ROM, no obvious new focal weakness. Left arm and both legs nontender. Cervical spine nontender. Right arm is nontender at the shoulder upper arm elbow forearm and wrist, it is also nontender at the entire right hand except for the thumb. The right thumb is diffusely tender without swelling or deformity noted other than some slight bruising to the distal volar pad and a small approximately 10% subungual hematoma. Patient is not specifically tender at the volar aspect of the IP joint. She has capillary refill less than 2 seconds and normal light touch to her thumb. She has extension fully against resistance as well as flexion against resistance  but decreased flexion due to pain in terms of the range of motion. She does not appear to have any instability of the IP or metacarpophalangeal joint of her right thumb.  Neurological:       Mental status and motor strength appears baseline for patient and situation.  Skin: No rash noted.  Psychiatric: She has a normal mood and affect.    ED Course  Procedures (including critical care time)  Labs Reviewed - No data to display No results found.   1. Thumb contusion   2. Avulsion fracture of thumb       MDM  Patient aware she may have a tiny 1 mm avulsion fracture which is unlikely to change her long-term clinical treatment.        Hurman Horn, MD 07/15/12 1625  Addendum: 20Oct: Questionable tiny avulsion fracture at IP joint  thumb.  Hurman Horn, MD 08/22/12 [phone]

## 2012-07-11 NOTE — ED Notes (Signed)
Slammed rt. thumb into car door, and pulled it out and now having complete rt. Arm pain. Rt. Thumb - Circulation intact, difficult to move, no numbness

## 2013-02-20 ENCOUNTER — Encounter (HOSPITAL_COMMUNITY): Payer: Self-pay | Admitting: Emergency Medicine

## 2013-02-20 ENCOUNTER — Emergency Department (HOSPITAL_COMMUNITY)
Admission: EM | Admit: 2013-02-20 | Discharge: 2013-02-20 | Disposition: A | Discharge status: Home / Self Care | Payer: BC Managed Care – PPO | Attending: Emergency Medicine | Admitting: Emergency Medicine

## 2013-02-20 ENCOUNTER — Emergency Department (HOSPITAL_COMMUNITY): Payer: BC Managed Care – PPO

## 2013-02-20 DIAGNOSIS — Y9289 Other specified places as the place of occurrence of the external cause: Secondary | ICD-10-CM | POA: Insufficient documentation

## 2013-02-20 DIAGNOSIS — S6992XA Unspecified injury of left wrist, hand and finger(s), initial encounter: Secondary | ICD-10-CM

## 2013-02-20 DIAGNOSIS — W230XXA Caught, crushed, jammed, or pinched between moving objects, initial encounter: Secondary | ICD-10-CM | POA: Insufficient documentation

## 2013-02-20 DIAGNOSIS — Z862 Personal history of diseases of the blood and blood-forming organs and certain disorders involving the immune mechanism: Secondary | ICD-10-CM | POA: Insufficient documentation

## 2013-02-20 DIAGNOSIS — S6990XA Unspecified injury of unspecified wrist, hand and finger(s), initial encounter: Secondary | ICD-10-CM | POA: Insufficient documentation

## 2013-02-20 DIAGNOSIS — Z79899 Other long term (current) drug therapy: Secondary | ICD-10-CM | POA: Insufficient documentation

## 2013-02-20 DIAGNOSIS — Y9389 Activity, other specified: Secondary | ICD-10-CM | POA: Insufficient documentation

## 2013-02-20 DIAGNOSIS — J45909 Unspecified asthma, uncomplicated: Secondary | ICD-10-CM | POA: Insufficient documentation

## 2013-02-20 DIAGNOSIS — Z8669 Personal history of other diseases of the nervous system and sense organs: Secondary | ICD-10-CM | POA: Insufficient documentation

## 2013-02-20 HISTORY — DX: Unspecified asthma, uncomplicated: J45.909

## 2013-02-20 MED ORDER — ALBUTEROL SULFATE (5 MG/ML) 0.5% IN NEBU
INHALATION_SOLUTION | RESPIRATORY_TRACT | Status: AC
  Filled 2013-02-20: qty 1

## 2013-02-20 MED ORDER — IPRATROPIUM BROMIDE 0.02 % IN SOLN
RESPIRATORY_TRACT | Status: AC
  Filled 2013-02-20: qty 2.5

## 2013-02-20 NOTE — ED Notes (Signed)
Pt sts left pinky finger got caught in dog leash this am; now having some pain and swelling and painful to move

## 2013-02-20 NOTE — Progress Notes (Signed)
Orthopedic Tech Progress Note Patient Details:  Patricia Arnold Mar 29, 1976 [id number]  Ortho Devices Type of Ortho Device: Buddy tape Ortho Device/Splint Location: buddy tape fingers Ortho Device/Splint Interventions: Application   Cammer, Mickie Bail 02/20/2013, 11:39 AM

## 2013-02-20 NOTE — ED Provider Notes (Signed)
History     CSN: [id number]  Arrival date & time 02/20/13  4098   First MD Initiated Contact with Patient 02/20/13 1025      Chief Complaint  Patient presents with  . Finger Injury    (Consider location/radiation/quality/duration/timing/severity/associated sxs/prior treatment) HPI Comments: 37 y.o. Female presents with pain to left pinky finger after it had gotten tangled in her dog's leash and dog pulled. Moderate pain, swelling. Painful ROM. Pain is constant, localized. Pt took no interventions. Worse with movement, better with rest.    Past Medical History  Diagnosis Date  . Anemia   . Headache   . Uveitis   . Asthma     Past Surgical History  Procedure Laterality Date  . Reconstrictive    . Elbow surgery      Family History  Problem Relation Age of Onset  . Other Neg Hx     History  Substance Use Topics  . Smoking status: Never Smoker   . Smokeless tobacco: Not on file  . Alcohol Use: No    OB History   Grav Para Term Preterm Abortions TAB SAB Ect Mult Living   3 1 1  0 1 0 1 0 0 1      Review of Systems  Constitutional: Negative for fever and diaphoresis.  HENT: Negative for neck pain and neck stiffness.   Eyes: Negative for visual disturbance.  Respiratory: Negative for apnea, chest tightness and shortness of breath.   Cardiovascular: Negative for chest pain and palpitations.  Gastrointestinal: Negative for nausea, vomiting, diarrhea and constipation.  Musculoskeletal: Positive for joint swelling. Negative for gait problem.       5th digit left hand  Skin: Negative for rash.  Neurological: Negative for dizziness, weakness, light-headedness, numbness and headaches.    Allergies  Penicillins; Sulfa antibiotics; Tizanidine; Tramadol; and Namenda  Home Medications   Current Outpatient Rx  Name  Route  Sig  Dispense  Refill  . albuterol (PROVENTIL HFA;VENTOLIN HFA) 108 (90 BASE) MCG/ACT inhaler   Inhalation   Inhale 2 puffs into the lungs every  6 (six) hours as needed for wheezing.         Marland Kitchen loteprednol (LOTEMAX) 0.5 % ophthalmic suspension   Both Eyes   Place 1 drop into both eyes every other day.           BP 119/70  Pulse 86  Temp(Src) 99.1 F (37.3 C) (Oral)  Resp 16  SpO2 96%  LMP 04/16/2012  Physical Exam  Nursing note and vitals reviewed. Constitutional: She is oriented to person, place, and time. She appears well-developed and well-nourished. No distress.  HENT:  Head: Normocephalic and atraumatic.  Eyes: EOM are normal. Pupils are equal, round, and reactive to light.  Neck: Normal range of motion. Neck supple.  No meningeal signs  Cardiovascular: Normal rate, regular rhythm and normal heart sounds.  Exam reveals no gallop and no friction rub.   No murmur heard. Pulmonary/Chest: Effort normal and breath sounds normal. No respiratory distress. She has no wheezes. She has no rales. She exhibits no tenderness.  Abdominal: Soft. Bowel sounds are normal. She exhibits no distension. There is no tenderness. There is no rebound and no guarding.  Musculoskeletal: Normal range of motion. She exhibits edema. She exhibits no tenderness.  5/5 strength throughout. Mild swelling. No erythema. No warmth. No effusion.   Neurological: She is alert and oriented to person, place, and time. No cranial nerve deficit.  Sensation to light touch and two  point discrimination intact. Neurovascularly intact.   Skin: Skin is warm and dry. She is not diaphoretic. No erythema.  Psychiatric: She has a normal mood and affect.    ED Course  Procedures (including critical care time)  Labs Reviewed - No data to display Dg Finger Little Left  02/20/2013  *RADIOLOGY REPORT*  Clinical Data: Painful left fifth digit.  LEFT LITTLE FINGER 2+V  Comparison: None.  Findings: Soft tissue swelling about the PIP joint.  Small radiopaque density within the soft tissues adjacent to the PIP joint and distal aspect of the proximal phalanx.  No fracture.  Joint spaces are maintained.  IMPRESSION: Small radiopaque density within the soft tissues adjacent to the PIP joint of unknown etiology or significance.  No bony abnormality.   Original Report Authenticated By: Charlett Nose, M.D.      1. Fingernail injury, left, initial encounter       MDM  Imaging shows no fracture. Neurovascularly intact. Directed pt to ice injury, take acetaminophen or ibuprofen for pain, and to elevate and rest the injury when possible. Buddy taped for support and comfort.   Glade Nurse, PA-C 02/20/13 1731

## 2013-02-21 NOTE — ED Provider Notes (Signed)
Medical screening examination/treatment/procedure(s) were performed by non-physician practitioner and as supervising physician I was immediately available for consultation/collaboration.  Martha K Linker, MD 02/21/13 0912 

## 2013-06-26 ENCOUNTER — Inpatient Hospital Stay (HOSPITAL_COMMUNITY)
Admission: AD | Admit: 2013-06-26 | Discharge: 2013-06-26 | Disposition: A | Discharge status: Home / Self Care | Payer: BC Managed Care – PPO | Source: Ambulatory Visit | Attending: Obstetrics and Gynecology | Admitting: Obstetrics and Gynecology

## 2013-06-26 ENCOUNTER — Encounter (HOSPITAL_COMMUNITY): Payer: Self-pay | Admitting: *Deleted

## 2013-06-26 DIAGNOSIS — N949 Unspecified condition associated with female genital organs and menstrual cycle: Secondary | ICD-10-CM | POA: Insufficient documentation

## 2013-06-26 DIAGNOSIS — N938 Other specified abnormal uterine and vaginal bleeding: Secondary | ICD-10-CM | POA: Insufficient documentation

## 2013-06-26 DIAGNOSIS — N921 Excessive and frequent menstruation with irregular cycle: Secondary | ICD-10-CM

## 2013-06-26 HISTORY — DX: Bipolar disorder, unspecified: F31.9

## 2013-06-26 LAB — URINALYSIS, ROUTINE W REFLEX MICROSCOPIC
Glucose, UA: NEGATIVE mg/dL
Ketones, ur: 15 mg/dL — AB
Leukocytes, UA: NEGATIVE
Nitrite: NEGATIVE
Specific Gravity, Urine: 1.025 (ref 1.005–1.030)
pH: 6 (ref 5.0–8.0)

## 2013-06-26 LAB — CBC
HCT: 35.5 % — ABNORMAL LOW (ref 36.0–46.0)
Hemoglobin: 12.6 g/dL (ref 12.0–15.0)
MCHC: 35.5 g/dL (ref 30.0–36.0)
RBC: 4.21 MIL/uL (ref 3.87–5.11)
WBC: 7.5 10*3/uL (ref 4.0–10.5)

## 2013-06-26 LAB — URINE MICROSCOPIC-ADD ON

## 2013-06-26 LAB — POCT PREGNANCY, URINE: Preg Test, Ur: NEGATIVE

## 2013-06-26 NOTE — MAU Provider Note (Signed)
History     CSN: [id number]  Arrival date and time: 06/26/13 2117   None     Chief Complaint  Patient presents with  . Vaginal Bleeding   HPI This is a 37 y.o. female who presents with c/o bleeding after DepoProvera. Had second injection of Depo in June.  Has been spotting for 2 weeks (1 tampon per day) but today had a "gush" which filled a tampon every 30 minutes x 3 tampons. Then started feeling weak and tired.  Is also somewhat dizzy and legs ache. Is followed at [facility] "for some kind of autoimmune problem".  States tests have been negative but they think she has something. Just diagnosed with bipolar also.   Sees Dr Miguel Aschoff as primary.  RN Note: Pt reports she started Depo Provera in April, had an injection on ? 06/24 and has had some spotting off/on but this am had a gush of blood and has had bleeding off/on all day. States she feels tired, dizzy, crampy and her legs hurt.       OB History   Grav Para Term Preterm Abortions TAB SAB Ect Mult Living   3 1 1  0 1 0 1 0 0 1      Past Medical History  Diagnosis Date  . Anemia   . Headache(784.0)   . Uveitis   . Asthma     Past Surgical History  Procedure Laterality Date  . Reconstrictive    . Elbow surgery      Family History  Problem Relation Age of Onset  . Other Neg Hx     History  Substance Use Topics  . Smoking status: Never Smoker   . Smokeless tobacco: Not on file  . Alcohol Use: No    Allergies:  Allergies  Allergen Reactions  . Penicillins Anaphylaxis  . Sulfa Antibiotics Anaphylaxis  . Tizanidine Anaphylaxis  . Tramadol Anaphylaxis  . Namenda [Memantine Hcl] Other (See Comments)    Eye infections    Prescriptions prior to admission  Medication Sig Dispense Refill  . lamoTRIgine (LAMICTAL) 100 MG tablet Take 100 mg by mouth at bedtime.      Marland Kitchen loteprednol (LOTEMAX) 0.5 % ophthalmic suspension Place 1 drop into both eyes every other day.      . traZODone (DESYREL) 50 MG tablet Take 50  mg by mouth at bedtime.      Marland Kitchen albuterol (PROVENTIL HFA;VENTOLIN HFA) 108 (90 BASE) MCG/ACT inhaler Inhale 2 puffs into the lungs every 6 (six) hours as needed for wheezing.        Review of Systems  Constitutional: Positive for malaise/fatigue. Negative for fever and chills.  Eyes: Negative for blurred vision.  Gastrointestinal: Negative for nausea, vomiting, abdominal pain, diarrhea and constipation.  Musculoskeletal: Negative for myalgias.  Skin: Negative for rash.  Neurological: Positive for dizziness and weakness. Negative for headaches.  Psychiatric/Behavioral: Positive for depression.   Physical Exam   Blood pressure 130/95, pulse 99, temperature 98.4 F (36.9 C), temperature source Oral, resp. rate 18, height 5\' 1"  (1.549 m), weight 55.339 kg (122 lb), last menstrual period 06/26/2013, SpO2 98.00%.  Filed Vitals:   06/26/13 2131 06/26/13 2151 06/26/13 2154 06/26/13 2157  BP: 130/95 112/73 114/74 123/80  Pulse: 99 78 86 94  Temp: 98.4 F (36.9 C)     TempSrc: Oral     Resp: 18     Height: 5\' 1"  (1.549 m)     Weight: 55.339 kg (122 lb)  SpO2: 98%       Physical Exam  Constitutional: She is oriented to person, place, and time. She appears well-developed and well-nourished. No distress.  HENT:  Head: Normocephalic.  Cardiovascular: Normal rate, regular rhythm and normal heart sounds.  Exam reveals no gallop and no friction rub.   No murmur heard. Respiratory: Effort normal and breath sounds normal. No respiratory distress. She has no wheezes. She has no rales. She exhibits no tenderness.  GI: Soft. She exhibits no distension and no mass. There is tenderness (slight tenderness of uterus with palpation, but no pain there when not palpated). There is no rebound and no guarding.  Genitourinary: Uterus normal. Vaginal discharge (scant old blood, no active flow) found.  Musculoskeletal: Normal range of motion.  Neurological: She is alert and oriented to person, place, and  time.  Skin: Skin is warm and dry.  Psychiatric: She has a normal mood and affect.   Results for orders placed during the hospital encounter of 06/26/13 (from the past 24 hour(s))  URINALYSIS, ROUTINE W REFLEX MICROSCOPIC     Status: Abnormal   Collection Time    06/26/13  9:33 PM      Result Value Range   Color, Urine YELLOW  YELLOW   APPearance CLEAR  CLEAR   Specific Gravity, Urine 1.025  1.005 - 1.030   pH 6.0  5.0 - 8.0   Glucose, UA NEGATIVE  NEGATIVE mg/dL   Hgb urine dipstick MODERATE (*) NEGATIVE   Bilirubin Urine NEGATIVE  NEGATIVE   Ketones, ur 15 (*) NEGATIVE mg/dL   Protein, ur NEGATIVE  NEGATIVE mg/dL   Urobilinogen, UA 0.2  0.0 - 1.0 mg/dL   Nitrite NEGATIVE  NEGATIVE   Leukocytes, UA NEGATIVE  NEGATIVE  URINE MICROSCOPIC-ADD ON     Status: Abnormal   Collection Time    06/26/13  9:33 PM      Result Value Range   Squamous Epithelial / LPF FEW (*) RARE   WBC, UA 0-2  <3 WBC/hpf   Bacteria, UA RARE  RARE   Urine-Other MUCOUS PRESENT    POCT PREGNANCY, URINE     Status: None   Collection Time    06/26/13 10:00 PM      Result Value Range   Preg Test, Ur NEGATIVE  NEGATIVE  CBC     Status: Abnormal   Collection Time    06/26/13 10:20 PM      Result Value Range   WBC 7.5  4.0 - 10.5 K/uL   RBC 4.21  3.87 - 5.11 MIL/uL   Hemoglobin 12.6  12.0 - 15.0 g/dL   HCT 16.1 (*) 09.6 - 04.5 %   MCV 84.3  78.0 - 100.0 fL   MCH 29.9  26.0 - 34.0 pg   MCHC 35.5  30.0 - 36.0 g/dL   RDW 40.9  81.1 - 91.4 %   Platelets 225  150 - 400 K/uL    MAU Course  Procedures  MDM Orthostatic vs checked >> normal Will check CBC (has hx anemia)  >>  Hgb 12.6  Assessment and Plan  A:  Breakthrough bleeding on DepoProvera      No evidence of anemia  P:  Discussed with Dr Henderson Cloud       Will have patient call Dr Tenny Craw tomorrow to see if he wants to put her on any meds       Have her check with her doctors at [facility] re: other symptoms  [facility] 06/26/2013, 9:51  PM  

## 2013-06-26 NOTE — MAU Note (Signed)
Pt reports she started Depo Provera in April, had an injection on ? 06/24 and has had some spotting off/on but this am had a gush of blood and has had bleeding off/on all day. States she feels tired, dizzy, crampy and her legs hurt.

## 2013-09-02 ENCOUNTER — Encounter (HOSPITAL_COMMUNITY): Payer: Self-pay | Admitting: Emergency Medicine

## 2013-09-02 ENCOUNTER — Other Ambulatory Visit (HOSPITAL_COMMUNITY)
Admission: RE | Admit: 2013-09-02 | Discharge: 2013-09-02 | Disposition: A | Discharge status: Home / Self Care | Payer: BC Managed Care – PPO | Source: Ambulatory Visit | Attending: Emergency Medicine | Admitting: Emergency Medicine

## 2013-09-02 ENCOUNTER — Emergency Department (INDEPENDENT_AMBULATORY_CARE_PROVIDER_SITE_OTHER)
Admission: EM | Admit: 2013-09-02 | Discharge: 2013-09-02 | Disposition: A | Discharge status: Home / Self Care | Payer: BC Managed Care – PPO | Source: Home / Self Care

## 2013-09-02 DIAGNOSIS — N72 Inflammatory disease of cervix uteri: Secondary | ICD-10-CM

## 2013-09-02 DIAGNOSIS — Z113 Encounter for screening for infections with a predominantly sexual mode of transmission: Secondary | ICD-10-CM | POA: Insufficient documentation

## 2013-09-02 DIAGNOSIS — N760 Acute vaginitis: Secondary | ICD-10-CM | POA: Insufficient documentation

## 2013-09-02 DIAGNOSIS — B3731 Acute candidiasis of vulva and vagina: Secondary | ICD-10-CM

## 2013-09-02 DIAGNOSIS — B373 Candidiasis of vulva and vagina: Secondary | ICD-10-CM

## 2013-09-02 DIAGNOSIS — N898 Other specified noninflammatory disorders of vagina: Secondary | ICD-10-CM

## 2013-09-02 LAB — POCT URINALYSIS DIP (DEVICE)
Bilirubin Urine: NEGATIVE
Glucose, UA: NEGATIVE mg/dL
Hgb urine dipstick: NEGATIVE
Ketones, ur: 15 mg/dL — AB
Nitrite: NEGATIVE
Specific Gravity, Urine: 1.025 (ref 1.005–1.030)
Urobilinogen, UA: 0.2 mg/dL (ref 0.0–1.0)
pH: 6 (ref 5.0–8.0)

## 2013-09-02 MED ORDER — FLUCONAZOLE 150 MG PO TABS: ORAL_TABLET | ORAL | Status: DC

## 2013-09-02 MED ORDER — METRONIDAZOLE 500 MG PO TABS: 500.0000 mg | ORAL_TABLET | Freq: Two times a day (BID) | ORAL | Status: DC

## 2013-09-02 MED ORDER — AZITHROMYCIN 250 MG PO TABS: ORAL_TABLET | ORAL | Status: DC

## 2013-09-02 NOTE — ED Provider Notes (Signed)
CSN: [id number]     Arrival date & time 09/02/13  1109 History   First MD Initiated Contact with Patient 09/02/13 1248     Chief Complaint  Patient presents with  . Vaginal Itching   (Consider location/radiation/quality/duration/timing/severity/associated sxs/prior Treatment) HPI Comments: 37 y o F with vaginal discharge, itching and dryness for 3 d   Past Medical History  Diagnosis Date  . Anemia   . Headache(784.0)   . Uveitis   . Asthma   . Bipolar depression    Past Surgical History  Procedure Laterality Date  . Reconstrictive    . Elbow surgery     Family History  Problem Relation Age of Onset  . Other Neg Hx    History  Substance Use Topics  . Smoking status: Never Smoker   . Smokeless tobacco: Not on file  . Alcohol Use: No   OB History   Grav Para Term Preterm Abortions TAB SAB Ect Mult Living   3 1 1  0 1 0 1 0 0 1     Review of Systems  Constitutional: Negative.   Gastrointestinal: Negative.   Genitourinary: Positive for frequency and vaginal discharge. Negative for dysuria, urgency, decreased urine volume, vaginal bleeding, difficulty urinating, menstrual problem and pelvic pain.  All other systems reviewed and are negative.    Allergies  Penicillins; Sulfa antibiotics; Tizanidine; Tramadol; and Namenda  Home Medications   Current Outpatient Rx  Name  Route  Sig  Dispense  Refill  . albuterol (PROVENTIL HFA;VENTOLIN HFA) 108 (90 BASE) MCG/ACT inhaler   Inhalation   Inhale 2 puffs into the lungs every 6 (six) hours as needed for wheezing.         Marland Kitchen azithromycin (ZITHROMAX) 250 MG tablet      Take all 4 tabs po now   4 each   0   . fluconazole (DIFLUCAN) 150 MG tablet      1 tab po x 1. May repeat in 72 hours if no improvement   2 tablet   0   . lamoTRIgine (LAMICTAL) 100 MG tablet   Oral   Take 100 mg by mouth at bedtime.         Marland Kitchen loteprednol (LOTEMAX) 0.5 % ophthalmic suspension   Both Eyes   Place 1 drop into both eyes every  other day.         . medroxyPROGESTERone (DEPO-PROVERA) 150 MG/ML injection   Intramuscular   Inject 150 mg into the muscle every 3 (three) months.         . metroNIDAZOLE (FLAGYL) 500 MG tablet   Oral   Take 1 tablet (500 mg total) by mouth 2 (two) times daily. X 7 days   14 tablet   0   . traZODone (DESYREL) 50 MG tablet   Oral   Take 50 mg by mouth at bedtime.          BP 129/89  Pulse 94  Temp(Src) 98.3 F (36.8 C) (Oral)  Resp 16  SpO2 100% Physical Exam  Nursing note and vitals reviewed. Constitutional: She is oriented to person, place, and time. She appears well-developed and well-nourished. No distress.  Neck: Normal range of motion. Neck supple.  Cardiovascular: Normal rate.   Pulmonary/Chest: Effort normal. No respiratory distress.  Abdominal: Soft. Bowel sounds are normal. She exhibits no distension and no mass. There is no tenderness. There is no rebound and no guarding.  Genitourinary: Vaginal discharge found.  NEFG Small amt thick white D/C vaginal  walls and vault as well as the cx. Cx: posterior, partially erythematous. No lesions +CMT and bilateral adnexal tenderness.  Musculoskeletal: She exhibits no edema.  Neurological: She is alert and oriented to person, place, and time. She exhibits normal muscle tone.  Skin: Skin is warm and dry.  Psychiatric: She has a normal mood and affect.    ED Course  Procedures (including critical care time) Labs Review Labs Reviewed  CERVICOVAGINAL ANCILLARY ONLY   Imaging Review No results found.  .this   MDM   1. Vaginal discharge   2. Cervicitis   3. Vulvovaginitis   4. Candida vaginitis      Azithromycin 1 gm po Diflucan Flagyl x 7 d Ancillary results pending  Hayden Rasmussen, NP 09/02/13 1318

## 2013-09-02 NOTE — ED Notes (Signed)
C/o 3-4 day duration of vaginal itching, clear vaginal d/c; depo provera for BC, no known problem for her partner

## 2013-09-02 NOTE — ED Provider Notes (Signed)
Medical screening examination/treatment/procedure(s) were performed by non-physician practitioner and as supervising physician I was immediately available for consultation/collaboration.  Leslee Home, M.D.   Reuben Likes, MD 09/02/13 [phone]

## 2013-09-06 ENCOUNTER — Telehealth (HOSPITAL_COMMUNITY): Payer: Self-pay | Admitting: *Deleted

## 2013-09-06 NOTE — ED Notes (Signed)
Pt. called on VM for her lab results. I called pt. Back and left a message for her to call me tomorrow between 2-5p @ [phone]. Patricia Arnold 09/06/2013

## 2013-09-06 NOTE — ED Notes (Signed)
GC/Chlamydia neg., Affirm: Candida pos., Gardnerella and Trich neg.  Pt. adequately treated with Diflucan. Vassie Moselle 09/06/2013

## 2013-09-07 NOTE — ED Notes (Signed)
I called pt. Pt. verified x 2 and given results.  Pt. told she was adequately treated for the yeast infection with Diflucan. Patricia Arnold 09/07/2013

## 2013-10-02 ENCOUNTER — Other Ambulatory Visit (HOSPITAL_COMMUNITY)
Admission: RE | Admit: 2013-10-02 | Discharge: 2013-10-02 | Disposition: A | Discharge status: Home / Self Care | Payer: BC Managed Care – PPO | Source: Ambulatory Visit | Attending: Emergency Medicine | Admitting: Emergency Medicine

## 2013-10-02 ENCOUNTER — Emergency Department (INDEPENDENT_AMBULATORY_CARE_PROVIDER_SITE_OTHER)
Admission: EM | Admit: 2013-10-02 | Discharge: 2013-10-02 | Disposition: A | Discharge status: Home / Self Care | Payer: BC Managed Care – PPO | Source: Home / Self Care

## 2013-10-02 ENCOUNTER — Encounter (HOSPITAL_COMMUNITY): Payer: Self-pay | Admitting: Emergency Medicine

## 2013-10-02 DIAGNOSIS — N72 Inflammatory disease of cervix uteri: Secondary | ICD-10-CM

## 2013-10-02 DIAGNOSIS — N760 Acute vaginitis: Secondary | ICD-10-CM

## 2013-10-02 DIAGNOSIS — Z113 Encounter for screening for infections with a predominantly sexual mode of transmission: Secondary | ICD-10-CM | POA: Insufficient documentation

## 2013-10-02 LAB — POCT URINALYSIS DIP (DEVICE)
Bilirubin Urine: NEGATIVE
Glucose, UA: NEGATIVE mg/dL
Hgb urine dipstick: NEGATIVE
Ketones, ur: NEGATIVE mg/dL
Nitrite: NEGATIVE
pH: 6.5 (ref 5.0–8.0)

## 2013-10-02 LAB — POCT PREGNANCY, URINE: Preg Test, Ur: NEGATIVE

## 2013-10-02 MED ORDER — AZITHROMYCIN 250 MG PO TABS: ORAL_TABLET | ORAL | Status: DC

## 2013-10-02 MED ORDER — METRONIDAZOLE 500 MG PO TABS: 500.0000 mg | ORAL_TABLET | Freq: Two times a day (BID) | ORAL | Status: DC

## 2013-10-02 MED ORDER — FLUCONAZOLE 150 MG PO TABS: ORAL_TABLET | ORAL | Status: DC

## 2013-10-02 NOTE — ED Notes (Signed)
x1 week of Vaginal itching, white discharge.  No vaginal odor.  Has left flank pain as well x1 week.  Has urinary frequency. Denies fever.  Denies any OTC.

## 2013-10-02 NOTE — ED Provider Notes (Signed)
CSN: [id number]     Arrival date & time 10/02/13  1153 History   None    Chief Complaint  Patient presents with  . Vaginal Itching    x1 week of Vaginal itching, white discharge.  No vaginal odor.  Has left flank pain as well x1 week.  Has urinary frequency.    (Consider location/radiation/quality/duration/timing/severity/associated sxs/prior Treatment) HPI Comments: 37 year old female complaining of vulvovaginal itching and a vaginal discharge for 5 days. She is also complaining of pain in the left pelvis it radiates to the left lower quadrant. Denies fever or GI symptoms. She was seen for similar symptoms one month ago.   Past Medical History  Diagnosis Date  . Anemia   . Headache(784.0)   . Uveitis   . Asthma   . Bipolar depression    Past Surgical History  Procedure Laterality Date  . Reconstrictive    . Elbow surgery     Family History  Problem Relation Age of Onset  . Other Neg Hx    History  Substance Use Topics  . Smoking status: Never Smoker   . Smokeless tobacco: Not on file  . Alcohol Use: No   OB History   Grav Para Term Preterm Abortions TAB SAB Ect Mult Living   3 1 1  0 1 0 1 0 0 1     Review of Systems  Constitutional: Negative.   HENT: Negative.   Respiratory: Negative.   Gastrointestinal: Negative.   Genitourinary: Positive for vaginal discharge, vaginal pain, menstrual problem and pelvic pain. Negative for urgency, flank pain and difficulty urinating.    Allergies  Penicillins; Sulfa antibiotics; Tizanidine; Tramadol; and Namenda  Home Medications   Current Outpatient Rx  Name  Route  Sig  Dispense  Refill  . loteprednol (LOTEMAX) 0.5 % ophthalmic suspension   Both Eyes   Place 1 drop into both eyes every other day.         . pregabalin (LYRICA) 100 MG capsule   Oral   Take 100 mg by mouth daily.         Marland Kitchen albuterol (PROVENTIL HFA;VENTOLIN HFA) 108 (90 BASE) MCG/ACT inhaler   Inhalation   Inhale 2 puffs into the lungs every 6  (six) hours as needed for wheezing.         Marland Kitchen azithromycin (ZITHROMAX) 250 MG tablet      Take all 4 tabs stat   4 each   0   . fluconazole (DIFLUCAN) 150 MG tablet      1 tablet every 3 days   3 tablet   0   . medroxyPROGESTERone (DEPO-PROVERA) 150 MG/ML injection   Intramuscular   Inject 150 mg into the muscle every 3 (three) months.         . metroNIDAZOLE (FLAGYL) 500 MG tablet   Oral   Take 1 tablet (500 mg total) by mouth 2 (two) times daily. X 7 days   14 tablet   0    BP 115/78  Pulse 77  Temp(Src) 99.6 F (37.6 C) (Oral)  Resp 17  SpO2 99%  LMP 01/30/2013 Physical Exam  Nursing note and vitals reviewed. Constitutional: She is oriented to person, place, and time. She appears well-developed and well-nourished. No distress.  Neck: Normal range of motion. Neck supple.  Pulmonary/Chest: Effort normal. No respiratory distress.  Abdominal: Soft.  Mild tenderness in the left lower quadrant greater tenderness in the left pelvis.  Genitourinary:  Normal external female genitalia Cervix anterior and left  of midline. Ectocervix with faint macular erythematous lesions. Os is closed with no apparent exudate or bleeding. There is a scant amount of thick  Sap green discharge in the vaginal vault. Bimanual with positive CMT and left adnexal tenderness. No right adnexal tenderness.   Neurological: She is alert and oriented to person, place, and time. She exhibits normal muscle tone.  Skin: Skin is warm and dry.  Psychiatric: She has a normal mood and affect.    ED Course  Procedures (including critical care time) Labs Review Labs Reviewed  POCT URINALYSIS DIP (DEVICE)  POCT PREGNANCY, URINE  CERVICOVAGINAL ANCILLARY ONLY   Imaging Review No results found. Results for orders placed during the hospital encounter of 10/02/13  POCT URINALYSIS DIP (DEVICE)      Result Value Range   Glucose, UA NEGATIVE  NEGATIVE mg/dL   Bilirubin Urine NEGATIVE  NEGATIVE    Ketones, ur NEGATIVE  NEGATIVE mg/dL   Specific Gravity, Urine 1.020  1.005 - 1.030   Hgb urine dipstick NEGATIVE  NEGATIVE   pH 6.5  5.0 - 8.0   Protein, ur NEGATIVE  NEGATIVE mg/dL   Urobilinogen, UA 0.2  0.0 - 1.0 mg/dL   Nitrite NEGATIVE  NEGATIVE   Leukocytes, UA NEGATIVE  NEGATIVE  POCT PREGNANCY, URINE      Result Value Range   Preg Test, Ur NEGATIVE  NEGATIVE      MDM   1. Cervicitis   2. Vulvovaginitis      Flagyl 500 bid Azithromycin 1 gm po Rx. Diflucan 150mg  1 q o d x 3 doses Cervical cytology pending      Hayden Rasmussen, NP 10/02/13 1437  Hayden Rasmussen, NP 10/02/13 1508  Hayden Rasmussen, NP 10/02/13 [phone]

## 2013-10-02 NOTE — Discharge Instructions (Signed)
Bacterial Vaginosis Bacterial vaginosis is an infection of the vagina. A healthy vagina has many kinds of good germs (bacteria). Sometimes the number of good germs can change. This allows bad germs to move in and cause an infection. You may be given medicine (antibiotics) to treat the infection. Or, you may not need treatment at all. HOME CARE  Take your medicine as told. Finish them even if you start to feel better.  Do not have sex until you finish your medicine.  Do not douche.  Practice safe sex.  Tell your sex partner that you have an infection. They should see their doctor for treatment if they have problems. GET HELP RIGHT AWAY IF:  You do not get better after 3 days of treatment.  You have grey fluid (discharge) coming from your vagina.  You have pain.  You have a temperature of 102 F (38.9 C) or higher. MAKE SURE YOU:   Understand these instructions.  Will watch your condition.  Will get help right away if you are not doing well or get worse. Document Released: 07/29/2008 Document Revised: 01/12/2012 Document Reviewed: 06/01/2013 [facility] Patient Information 2014 [place], [place].  Cervicitis Cervicitis is a soreness and swelling (inflammation) of the cervix. Your cervix is located at the bottom of your uterus. It opens up to the vagina. CAUSES   Sexually transmitted infections (STIs).   Allergic reaction.   Medicines or birth control devices that are put in the vagina.   Injury to the cervix.   Bacterial infections.  RISK FACTORS You are at greater risk if you:  Have unprotected sexual intercourse.  Have sexual intercourse with many partners.  Began sexual intercourse at an early age.  Have a history of STIs. SYMPTOMS  There may be no symptoms. If symptoms occur, they may include:   Grey, white, yellow, or bad-smelling vaginal discharge.   Pain or itching of the area outside the vagina.   Painful sexual intercourse.   Lower abdominal  or lower back pain, especially during intercourse.   Frequent urination.   Abnormal vaginal bleeding between periods, after sexual intercourse, or after menopause.   Pressure or a heavy feeling in the pelvis.  DIAGNOSIS  Diagnosis is made after a pelvic exam. Other tests may include:   Examination of any discharge under a microscope (wet prep).   A Pap test.  TREATMENT  Treatment will depend on the cause of cervicitis. If it is caused by an STI, both you and your partner will need to be treated. Antibiotic medicines will be given.  HOME CARE INSTRUCTIONS   Do not have sexual intercourse until your health care provider says it is okay.   Do not have sexual intercourse until your partner has been treated, if your cervicitis is caused by an STI.   Take your antibiotics as directed. Finish them even if you start to feel better.  SEEK MEDICAL CARE IF:  Your symptoms come back.   You have a fever.  MAKE SURE YOU:   Understand these instructions.  Will watch your condition.  Will get help right away if you are not doing well or get worse. Document Released: 10/20/2005 Document Revised: 06/22/2013 Document Reviewed: 04/13/2013 [facility] Patient Information 2014 [place], [place].  Vaginitis Vaginitis is an inflammation of the vagina. It is most often caused by a change in the normal balance of the bacteria and yeast that live in the vagina. This change in balance causes an overgrowth of certain bacteria or yeast, which causes the inflammation.  There are different types of vaginitis, but the most common types are:  Bacterial vaginosis.  Yeast infection (candidiasis).  Trichomoniasis vaginitis. This is a sexually transmitted infection (STI).  Viral vaginitis.  Atropic vaginitis.  Allergic vaginitis. CAUSES  The cause depends on the type of vaginitis. Vaginitis can be caused by:  Bacteria (bacterial vaginosis).  Yeast (yeast infection).  A parasite  (trichomoniasis vaginitis)  A virus (viral vaginitis).  Low hormone levels (atrophic vaginitis). Low hormone levels can occur during pregnancy, breastfeeding, or after menopause.  Irritants, such as bubble baths, scented tampons, and feminine sprays (allergic vaginitis). Other factors can change the normal balance of the yeast and bacteria that live in the vagina. These include:  Antibiotic medicines.  Poor hygiene.  Diaphragms, vaginal sponges, spermicides, birth control pills, and intrauterine devices (IUD).  Sexual intercourse.  Infection.  Uncontrolled diabetes.  A weakened immune system. SYMPTOMS  Symptoms can vary depending on the cause of the vaginitis. Common symptoms include:  Abnormal vaginal discharge.  The discharge is white, gray, or yellow with bacterial vaginosis.  The discharge is thick, white, and cheesy with a yeast infection.  The discharge is frothy and yellow or greenish with trichomoniasis.  A bad vaginal odor.  The odor is fishy with bacterial vaginosis.  Vaginal itching, pain, or swelling.  Painful intercourse.  Pain or burning when urinating. Sometimes, there are no symptoms. TREATMENT  Treatment will vary depending on the type of infection.   Bacterial vaginosis and trichomoniasis are often treated with antibiotic creams or pills.  Yeast infections are often treated with antifungal medicines, such as vaginal creams or suppositories.  Viral vaginitis has no cure, but symptoms can be treated with medicines that relieve discomfort. Your sexual partner should be treated as well.  Atrophic vaginitis may be treated with an estrogen cream, pill, suppository, or vaginal ring. If vaginal dryness occurs, lubricants and moisturizing creams may help. You may be told to avoid scented soaps, sprays, or douches.  Allergic vaginitis treatment involves quitting the use of the product that is causing the problem. Vaginal creams can be used to treat the  symptoms. HOME CARE INSTRUCTIONS   Take all medicines as directed by your caregiver.  Keep your genital area clean and dry. Avoid soap and only rinse the area with water.  Avoid douching. It can remove the healthy bacteria in the vagina.  Do not use tampons or have sexual intercourse until your vaginitis has been treated. Use sanitary pads while you have vaginitis.  Wipe from front to back. This avoids the spread of bacteria from the rectum to the vagina.  Let air reach your genital area.  Wear cotton underwear to decrease moisture buildup.  Avoid wearing underwear while you sleep until your vaginitis is gone.  Avoid tight pants and underwear or nylons without a cotton panel.  Take off wet clothing (especially bathing suits) as soon as possible.  Use mild, non-scented products. Avoid using irritants, such as:  Scented feminine sprays.  Fabric softeners.  Scented detergents.  Scented tampons.  Scented soaps or bubble baths.  Practice safe sex and use condoms. Condoms may prevent the spread of trichomoniasis and viral vaginitis. SEEK MEDICAL CARE IF:   You have abdominal pain.  You have a fever or persistent symptoms for more than 2 3 days.  You have a fever and your symptoms suddenly get worse. Document Released: 08/17/2007 Document Revised: 07/14/2012 Document Reviewed: 04/01/2012 [facility] Patient Information 2014 [place], [place].

## 2013-10-03 ENCOUNTER — Emergency Department (INDEPENDENT_AMBULATORY_CARE_PROVIDER_SITE_OTHER)
Admission: EM | Admit: 2013-10-03 | Discharge: 2013-10-03 | Disposition: A | Discharge status: Home / Self Care | Payer: BC Managed Care – PPO | Source: Home / Self Care | Attending: Family Medicine | Admitting: Family Medicine

## 2013-10-03 ENCOUNTER — Other Ambulatory Visit (HOSPITAL_COMMUNITY)
Admission: RE | Admit: 2013-10-03 | Discharge: 2013-10-03 | Disposition: A | Discharge status: Home / Self Care | Payer: BC Managed Care – PPO | Source: Ambulatory Visit | Attending: Family Medicine | Admitting: Family Medicine

## 2013-10-03 ENCOUNTER — Encounter (HOSPITAL_COMMUNITY): Payer: Self-pay | Admitting: Emergency Medicine

## 2013-10-03 DIAGNOSIS — Z113 Encounter for screening for infections with a predominantly sexual mode of transmission: Secondary | ICD-10-CM | POA: Insufficient documentation

## 2013-10-03 DIAGNOSIS — N760 Acute vaginitis: Secondary | ICD-10-CM | POA: Insufficient documentation

## 2013-10-03 NOTE — ED Notes (Signed)
Patient called back for recollection of specimens.

## 2013-10-03 NOTE — ED Provider Notes (Signed)
CSN: [id number]     Arrival date & time 10/03/13  1652 History   First MD Initiated Contact with Patient 10/03/13 1731     Chief Complaint  Patient presents with  . Exposure to STD   (Consider location/radiation/quality/duration/timing/severity/associated sxs/prior Treatment) Patient is a 37 y.o. female presenting with vaginal discharge. The history is provided by the patient.  Vaginal Discharge Vaginal discharge characteristics: here yest for eval however swabs incorrectly labelled and here for repeat, was already treated yest.   Past Medical History  Diagnosis Date  . Anemia   . Headache(784.0)   . Uveitis   . Asthma   . Bipolar depression    Past Surgical History  Procedure Laterality Date  . Reconstrictive    . Elbow surgery     Family History  Problem Relation Age of Onset  . Other Neg Hx    History  Substance Use Topics  . Smoking status: Never Smoker   . Smokeless tobacco: Not on file  . Alcohol Use: No   OB History   Grav Para Term Preterm Abortions TAB SAB Ect Mult Living   3 1 1  0 1 0 1 0 0 1     Review of Systems  Genitourinary: Positive for vaginal discharge and pelvic pain.    Allergies  Penicillins; Sulfa antibiotics; Tizanidine; Tramadol; and Namenda  Home Medications   Current Outpatient Rx  Name  Route  Sig  Dispense  Refill  . albuterol (PROVENTIL HFA;VENTOLIN HFA) 108 (90 BASE) MCG/ACT inhaler   Inhalation   Inhale 2 puffs into the lungs every 6 (six) hours as needed for wheezing.         Marland Kitchen azithromycin (ZITHROMAX) 250 MG tablet      Take all 4 tabs stat   4 each   0   . fluconazole (DIFLUCAN) 150 MG tablet      1 tablet every 3 days   3 tablet   0   . loteprednol (LOTEMAX) 0.5 % ophthalmic suspension   Both Eyes   Place 1 drop into both eyes every other day.         . medroxyPROGESTERone (DEPO-PROVERA) 150 MG/ML injection   Intramuscular   Inject 150 mg into the muscle every 3 (three) months.         . metroNIDAZOLE  (FLAGYL) 500 MG tablet   Oral   Take 1 tablet (500 mg total) by mouth 2 (two) times daily. X 7 days   14 tablet   0   . pregabalin (LYRICA) 100 MG capsule   Oral   Take 100 mg by mouth daily.          BP 112/64  Pulse 80  Temp(Src) 98.3 F (36.8 C) (Oral)  Resp 16  SpO2 100%  LMP 01/30/2013 Physical Exam  Nursing note and vitals reviewed. Constitutional: She is oriented to person, place, and time. She appears well-developed and well-nourished. No distress.  Abdominal: Soft. Bowel sounds are normal. There is no tenderness.  Genitourinary: No vaginal discharge found.  Neurological: She is alert and oriented to person, place, and time.  Skin: Skin is warm and dry.    ED Course  Procedures (including critical care time) Labs Review Labs Reviewed  CERVICOVAGINAL ANCILLARY ONLY  CERVICOVAGINAL ANCILLARY ONLY   Imaging Review No results found.  EKG Interpretation    Date/Time:    Ventricular Rate:    PR Interval:    QRS Duration:   QT Interval:    QTC Calculation:  R Axis:     Text Interpretation:              MDM      Linna Hoff, MD 10/06/13 2123

## 2013-10-05 NOTE — ED Provider Notes (Signed)
Medical screening examination/treatment/procedure(s) were performed by resident physician or non-physician practitioner and as supervising physician I was immediately available for consultation/collaboration.   Karleen Seebeck DOUGLAS MD.   Tanner Yeley D Kastin Cerda, MD 10/05/13 1908 

## 2013-11-27 ENCOUNTER — Emergency Department (HOSPITAL_COMMUNITY): Payer: BC Managed Care – PPO

## 2013-11-27 ENCOUNTER — Emergency Department (HOSPITAL_COMMUNITY)
Admission: EM | Admit: 2013-11-27 | Discharge: 2013-11-27 | Disposition: A | Discharge status: Home / Self Care | Payer: BC Managed Care – PPO | Attending: Emergency Medicine | Admitting: Emergency Medicine

## 2013-11-27 ENCOUNTER — Encounter (HOSPITAL_COMMUNITY): Payer: Self-pay | Admitting: Emergency Medicine

## 2013-11-27 DIAGNOSIS — F319 Bipolar disorder, unspecified: Secondary | ICD-10-CM | POA: Insufficient documentation

## 2013-11-27 DIAGNOSIS — Z79899 Other long term (current) drug therapy: Secondary | ICD-10-CM | POA: Insufficient documentation

## 2013-11-27 DIAGNOSIS — J45909 Unspecified asthma, uncomplicated: Secondary | ICD-10-CM | POA: Insufficient documentation

## 2013-11-27 DIAGNOSIS — M79609 Pain in unspecified limb: Secondary | ICD-10-CM | POA: Insufficient documentation

## 2013-11-27 DIAGNOSIS — Z880 Allergy status to penicillin: Secondary | ICD-10-CM | POA: Insufficient documentation

## 2013-11-27 DIAGNOSIS — Z8669 Personal history of other diseases of the nervous system and sense organs: Secondary | ICD-10-CM | POA: Insufficient documentation

## 2013-11-27 DIAGNOSIS — M79644 Pain in right finger(s): Secondary | ICD-10-CM

## 2013-11-27 DIAGNOSIS — Z862 Personal history of diseases of the blood and blood-forming organs and certain disorders involving the immune mechanism: Secondary | ICD-10-CM | POA: Insufficient documentation

## 2013-11-27 MED ORDER — HYDROCODONE-ACETAMINOPHEN 5-325 MG PO TABS: 1.0000 | ORAL_TABLET | ORAL | Status: DC | PRN

## 2013-11-27 NOTE — ED Notes (Signed)
Pt to Xray.

## 2013-11-27 NOTE — ED Notes (Signed)
Patient noticed Thursday that her right hand had a bone out of place and yesterday she started to have increased pain and numbness and tingling to her thumb and index finger. She is able to move all digits.

## 2013-11-27 NOTE — ED Notes (Signed)
Pged ortho tech for wrist splint

## 2013-11-27 NOTE — Discharge Instructions (Signed)
Musculoskeletal Pain °Musculoskeletal pain is muscle and boney aches and pains. These pains can occur in any part of the body. Your caregiver may treat you without knowing the cause of the pain. They may treat you if blood or urine tests, X-rays, and other tests were normal.  °CAUSES °There is often not a definite cause or reason for these pains. These pains may be caused by a type of germ (virus). The discomfort may also come from overuse. Overuse includes working out too hard when your body is not fit. Boney aches also come from weather changes. Bone is sensitive to atmospheric pressure changes. °HOME CARE INSTRUCTIONS  °· Ask when your test results will be ready. Make sure you get your test results. °· Only take over-the-counter or prescription medicines for pain, discomfort, or fever as directed by your caregiver. If you were given medications for your condition, do not drive, operate machinery or power tools, or sign legal documents for 24 hours. Do not drink alcohol. Do not take sleeping pills or other medications that may interfere with treatment. °· Continue all activities unless the activities cause more pain. When the pain lessens, slowly resume normal activities. Gradually increase the intensity and duration of the activities or exercise. °· During periods of severe pain, bed rest may be helpful. Lay or sit in any position that is comfortable. °· Putting ice on the injured area. °· Put ice in a bag. °· Place a towel between your skin and the bag. °· Leave the ice on for 15 to 20 minutes, 3 to 4 times a day. °· Follow up with your caregiver for continued problems and no reason can be found for the pain. If the pain becomes worse or does not go away, it may be necessary to repeat tests or do additional testing. Your caregiver may need to look further for a possible cause. °SEEK IMMEDIATE MEDICAL CARE IF: °· You have pain that is getting worse and is not relieved by medications. °· You develop chest pain  that is associated with shortness or breath, sweating, feeling sick to your stomach (nauseous), or throw up (vomit). °· Your pain becomes localized to the abdomen. °· You develop any new symptoms that seem different or that concern you. °MAKE SURE YOU:  °· Understand these instructions. °· Will watch your condition. °· Will get help right away if you are not doing well or get worse. °Document Released: 10/20/2005 Document Revised: 01/12/2012 Document Reviewed: 06/24/2013 °[employer]® Patient Information ©2014 [employer]. ° °

## 2013-11-27 NOTE — ED Provider Notes (Signed)
CSN: [id number]     Arrival date & time 11/27/13  1716 History   First MD Initiated Contact with Patient 11/27/13 1725     Chief Complaint  Patient presents with  . Finger Injury   (Consider location/radiation/quality/duration/timing/severity/associated sxs/prior Treatment) HPI  Past Medical History  Diagnosis Date  . Anemia   . Headache(784.0)   . Uveitis   . Asthma   . Bipolar depression    Past Surgical History  Procedure Laterality Date  . Reconstrictive    . Elbow surgery     Family History  Problem Relation Age of Onset  . Other Neg Hx    History  Substance Use Topics  . Smoking status: Never Smoker   . Smokeless tobacco: Not on file  . Alcohol Use: No   OB History   Grav Para Term Preterm Abortions TAB SAB Ect Mult Living   3 1 1  0 1 0 1 0 0 1     Review of Systems  Allergies  Namenda; Penicillins; Sulfa antibiotics; Tizanidine; and Tramadol  Home Medications   Current Outpatient Rx  Name  Route  Sig  Dispense  Refill  . estrogens, conjugated, (PREMARIN) 1.25 MG tablet   Oral   Take 1.25 mg by mouth daily.         Marland Kitchen lamoTRIgine (LAMICTAL) 200 MG tablet   Oral   Take 200 mg by mouth at bedtime.         . medroxyPROGESTERone (DEPO-PROVERA) 150 MG/ML injection   Intramuscular   Inject 150 mg into the muscle every 3 (three) months.         . ranitidine (ZANTAC) 150 MG tablet   Oral   Take 150 mg by mouth 2 (two) times daily.         . zaleplon (SONATA) 10 MG capsule   Oral   Take 10 mg by mouth at bedtime.          BP 104/60  Pulse 95  Temp(Src) 98.2 F (36.8 C) (Oral)  Resp 14  SpO2 97% Physical Exam  Nursing note and vitals reviewed. Constitutional: She is oriented to person, place, and time. She appears well-developed and well-nourished. No distress.  HENT:  Head: Normocephalic and atraumatic.  Mouth/Throat: Oropharynx is clear and moist.  Eyes: Conjunctivae are normal. No scleral icterus.  Pulmonary/Chest: Effort  normal.  Musculoskeletal:       Right hand: She exhibits tenderness. She exhibits normal range of motion, no bony tenderness, normal two-point discrimination, normal capillary refill and no swelling. Normal sensation noted. Normal strength noted.       Hands: Neurological: She is alert and oriented to person, place, and time. She exhibits normal muscle tone. Coordination normal.  Skin: Skin is warm and dry. No rash noted. No erythema. No pallor.  Psychiatric: She has a normal mood and affect. Her behavior is normal. Judgment and thought content normal.    ED Course  Procedures (including critical care time) Labs Review Labs Reviewed - No data to display Imaging Review Dg Hand Complete Right  11/27/2013   CLINICAL DATA:  Right thumb pain and numbness in the right hand.  EXAM: RIGHT HAND - COMPLETE 3+ VIEW  COMPARISON:  11/10/2004 and 07/11/2012  FINDINGS: There is no fracture, dislocation, or other significant abnormality. No radiodense foreign bodies.  IMPRESSION: Normal exam.   Electronically Signed   By: Geanie Cooley M.D.   On: 11/27/2013 18:38    EKG Interpretation   None  MDM  Right thumb pain  Patient here with pain to right thumb and wrist for the past day - she has the sensation that something is out of place, but x-rays do not reveal dislocation.  I suspect likely carpal tunnel syndrome - patient has hand surgeon in [place] that she will follow up with.  Placed in wrist splint.  I personally performed the services described in this documentation, which was scribed in my presence. The recorded information has been reviewed and is accurate.    Izola PriceFrances C. Marisue HumbleSanford, [place] JerseyPA-C 11/27/13 1853

## 2013-11-27 NOTE — Progress Notes (Signed)
Orthopedic Tech Progress Note Patient Details:  Patricia Arnold 17-Mar-1976 [id number]  Ortho Devices Type of Ortho Device: Velcro wrist splint Ortho Device/Splint Location: RUE Ortho Device/Splint Interventions: Ordered;Application   Jennye MoccasinHughes, Amyla Heffner Craig 11/27/2013, 7:33 PM

## 2013-11-27 NOTE — ED Provider Notes (Signed)
CSN: [id number]     Arrival date & time 11/27/13  1716 History  This chart was scribed for non-physician practitioner, Wylene SimmerFrancis Unnamed Hino, PA-C,working with Ethelda ChickMartha K Linker, MD, by Karle PlumberJennifer Tensley, ED Scribe.  This patient was seen in room TR08C/TR08C and the patient's care was started at 5:34 PM.  Chief Complaint  Patient presents with  . Finger Injury   The history is provided by the patient. No language interpreter was used.   HPI Comments:  Patricia Arnold is a 38 y.o. female who presents to the Emergency Department complaining of right thumb pain that started three days ago. Pt reports associated numbness and tingling and states the pain radiated up her right arm. Pt does not remember injuring her thumb. She states it feels different than her left thumb and is afraid there is a dislocation. She reports typing and using a mouse all day for work. She denies wrist pain. 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 Pt states she is right-hand dominant.   Past Medical History  Diagnosis Date  . Anemia   . Headache(784.0)   . Uveitis   . Asthma   . Bipolar depression    Past Surgical History  Procedure Laterality Date  . Reconstrictive    . Elbow surgery     Family History  Problem Relation Age of Onset  . Other Neg Hx    History  Substance Use Topics  . Smoking status: Never Smoker   . Smokeless tobacco: Not on file  . Alcohol Use: No   OB History   Grav Para Term Preterm Abortions TAB SAB Ect Mult Living   3 1 1  0 1 0 1 0 0 1     Review of Systems  Musculoskeletal: Positive for arthralgias (right hand pain).  Neurological: Positive for numbness.  All other systems reviewed and are negative.    Allergies  Penicillins; Sulfa antibiotics; Tizanidine; Tramadol; and Namenda  Home Medications   Current Outpatient Rx  Name  Route  Sig  Dispense  Refill  . albuterol (PROVENTIL HFA;VENTOLIN HFA) 108 (90 BASE) MCG/ACT inhaler   Inhalation   Inhale 2 puffs into the lungs every 6  (six) hours as needed for wheezing.         Marland Kitchen. azithromycin (ZITHROMAX) 250 MG tablet      Take all 4 tabs stat   4 each   0   . fluconazole (DIFLUCAN) 150 MG tablet      1 tablet every 3 days   3 tablet   0   . loteprednol (LOTEMAX) 0.5 % ophthalmic suspension   Both Eyes   Place 1 drop into both eyes every other day.         . medroxyPROGESTERone (DEPO-PROVERA) 150 MG/ML injection   Intramuscular   Inject 150 mg into the muscle every 3 (three) months.         . metroNIDAZOLE (FLAGYL) 500 MG tablet   Oral   Take 1 tablet (500 mg total) by mouth 2 (two) times daily. X 7 days   14 tablet   0   . pregabalin (LYRICA) 100 MG capsule   Oral   Take 100 mg by mouth daily.          Triage Vitals: BP 104/60  Pulse 95  Temp(Src) 98.2 F (36.8 C) (Oral)  Resp 14  SpO2 97% Physical Exam  Nursing note and vitals reviewed. Constitutional: She is oriented to person, place, and time. She appears well-developed and well-nourished. No distress.  HENT:  Head: Normocephalic and atraumatic.  Mouth/Throat: Oropharynx is clear and moist.  Eyes: Conjunctivae are normal. No scleral icterus.  Pulmonary/Chest: Effort normal.  Musculoskeletal: Normal range of motion. She exhibits tenderness. She exhibits no edema.       Right hand: She exhibits tenderness and bony tenderness. She exhibits normal range of motion, normal capillary refill and no swelling. Normal sensation noted. Normal strength noted.       Hands: Neurological: She is alert and oriented to person, place, and time. She exhibits normal muscle tone. Coordination normal.  Skin: Skin is warm and dry. No rash noted. No erythema. No pallor.  Psychiatric: She has a normal mood and affect. Her behavior is normal. Judgment and thought content normal.    ED Course  Procedures (including critical care time) DIAGNOSTIC STUDIES: Oxygen Saturation is 97% on RA, normal by my interpretation.   COORDINATION OF CARE: 5:37 PM- Will  X-Ray right thumb. Pt verbalizes understanding and agrees to plan.  Medications - No data to display  Labs Review Labs Reviewed - No data to display Imaging Review Dg Hand Complete Right  11/27/2013   CLINICAL DATA:  Right thumb pain and numbness in the right hand.  EXAM: RIGHT HAND - COMPLETE 3+ VIEW  COMPARISON:  11/10/2004 and 07/11/2012  FINDINGS: There is no fracture, dislocation, or other significant abnormality. No radiodense foreign bodies.  IMPRESSION: Normal exam.   Electronically Signed   By: Geanie Cooley M.D.   On: 11/27/2013 18:38    EKG Interpretation   None       MDM  Right thumb pain  Patient here with pain to right thumb and wrist for the past day - she has the sensation that something is out of place, but x-rays do not reveal dislocation.  I suspect likely carpal tunnel syndrome - patient has hand surgeon in [place] that she will follow up with.  Placed in wrist splint.  I personally performed the services described in this documentation, which was scribed in my presence. The recorded information has been reviewed and is accurate.    Izola Price Sharolyn Douglas 11/27/13 1853   I personally performed the services described in this documentation, which was scribed in my presence. The recorded information has been reviewed and is accurate.    Izola Price Marisue Humble, [place] 12/15/13 1927

## 2013-11-27 NOTE — ED Provider Notes (Signed)
Medical screening examination/treatment/procedure(s) were performed by non-physician practitioner and as supervising physician I was immediately available for consultation/collaboration.  EKG Interpretation   None        Ethelda ChickMartha K Linker, MD 11/27/13 [phone]

## 2013-12-17 NOTE — ED Provider Notes (Signed)
Medical screening examination/treatment/procedure(s) were performed by non-physician practitioner and as supervising physician I was immediately available for consultation/collaboration.  EKG Interpretation   None        Ethelda ChickMartha K Linker, MD 12/17/13 [phone]

## 2014-02-13 ENCOUNTER — Emergency Department (HOSPITAL_COMMUNITY)
Admission: EM | Admit: 2014-02-13 | Discharge: 2014-02-13 | Discharge status: Against Medical Advice | Payer: BC Managed Care – PPO | Attending: Emergency Medicine | Admitting: Emergency Medicine

## 2014-02-13 ENCOUNTER — Encounter (HOSPITAL_COMMUNITY): Payer: Self-pay | Admitting: Emergency Medicine

## 2014-02-13 DIAGNOSIS — R209 Unspecified disturbances of skin sensation: Secondary | ICD-10-CM | POA: Insufficient documentation

## 2014-02-13 DIAGNOSIS — R220 Localized swelling, mass and lump, head: Secondary | ICD-10-CM | POA: Insufficient documentation

## 2014-02-13 DIAGNOSIS — R221 Localized swelling, mass and lump, neck: Principal | ICD-10-CM

## 2014-02-13 DIAGNOSIS — J45909 Unspecified asthma, uncomplicated: Secondary | ICD-10-CM | POA: Insufficient documentation

## 2014-02-13 NOTE — ED Notes (Signed)
Pt states she awoke this morning with swelling to the left side of her face.  Pt sattes she has some feeling but it is reduced from the other side.  Pt has good grip strength and has not issue with resistance or against gravity in her legs or arms.  No changes in voice.  Pt states some feeling of "Pulling: in the left side of her neck.

## 2014-03-16 ENCOUNTER — Encounter (HOSPITAL_COMMUNITY): Payer: Self-pay | Admitting: Emergency Medicine

## 2014-03-16 ENCOUNTER — Emergency Department (HOSPITAL_COMMUNITY): Payer: BC Managed Care – PPO

## 2014-03-16 ENCOUNTER — Emergency Department (HOSPITAL_COMMUNITY)
Admission: EM | Admit: 2014-03-16 | Discharge: 2014-03-16 | Disposition: A | Discharge status: Home / Self Care | Payer: BC Managed Care – PPO | Attending: Emergency Medicine | Admitting: Emergency Medicine

## 2014-03-16 DIAGNOSIS — H9319 Tinnitus, unspecified ear: Secondary | ICD-10-CM | POA: Insufficient documentation

## 2014-03-16 DIAGNOSIS — Z862 Personal history of diseases of the blood and blood-forming organs and certain disorders involving the immune mechanism: Secondary | ICD-10-CM | POA: Insufficient documentation

## 2014-03-16 DIAGNOSIS — R61 Generalized hyperhidrosis: Secondary | ICD-10-CM | POA: Insufficient documentation

## 2014-03-16 DIAGNOSIS — R519 Headache, unspecified: Secondary | ICD-10-CM

## 2014-03-16 DIAGNOSIS — R112 Nausea with vomiting, unspecified: Secondary | ICD-10-CM | POA: Insufficient documentation

## 2014-03-16 DIAGNOSIS — Z8679 Personal history of other diseases of the circulatory system: Secondary | ICD-10-CM | POA: Insufficient documentation

## 2014-03-16 DIAGNOSIS — Z79899 Other long term (current) drug therapy: Secondary | ICD-10-CM | POA: Insufficient documentation

## 2014-03-16 DIAGNOSIS — Z8659 Personal history of other mental and behavioral disorders: Secondary | ICD-10-CM | POA: Insufficient documentation

## 2014-03-16 DIAGNOSIS — R197 Diarrhea, unspecified: Secondary | ICD-10-CM | POA: Insufficient documentation

## 2014-03-16 DIAGNOSIS — J45909 Unspecified asthma, uncomplicated: Secondary | ICD-10-CM | POA: Insufficient documentation

## 2014-03-16 DIAGNOSIS — R51 Headache: Secondary | ICD-10-CM | POA: Insufficient documentation

## 2014-03-16 DIAGNOSIS — Z3202 Encounter for pregnancy test, result negative: Secondary | ICD-10-CM | POA: Insufficient documentation

## 2014-03-16 DIAGNOSIS — Z880 Allergy status to penicillin: Secondary | ICD-10-CM | POA: Insufficient documentation

## 2014-03-16 LAB — URINE MICROSCOPIC-ADD ON

## 2014-03-16 LAB — URINALYSIS, ROUTINE W REFLEX MICROSCOPIC
BILIRUBIN URINE: NEGATIVE
Glucose, UA: NEGATIVE mg/dL
Ketones, ur: 15 mg/dL — AB
Leukocytes, UA: NEGATIVE
Nitrite: NEGATIVE
PH: 6 (ref 5.0–8.0)
Protein, ur: 30 mg/dL — AB
SPECIFIC GRAVITY, URINE: 1.016 (ref 1.005–1.030)
Urobilinogen, UA: 0.2 mg/dL (ref 0.0–1.0)

## 2014-03-16 LAB — COMPREHENSIVE METABOLIC PANEL
ALT: 18 U/L (ref 0–35)
AST: 21 U/L (ref 0–37)
Albumin: 4.3 g/dL (ref 3.5–5.2)
Alkaline Phosphatase: 48 U/L (ref 39–117)
BILIRUBIN TOTAL: 0.3 mg/dL (ref 0.3–1.2)
BUN: 12 mg/dL (ref 6–23)
CALCIUM: 9.5 mg/dL (ref 8.4–10.5)
CHLORIDE: 103 meq/L (ref 96–112)
CO2: 21 meq/L (ref 19–32)
Creatinine, Ser: 0.71 mg/dL (ref 0.50–1.10)
GLUCOSE: 97 mg/dL (ref 70–99)
Potassium: 3.7 mEq/L (ref 3.7–5.3)
Sodium: 138 mEq/L (ref 137–147)
Total Protein: 7.3 g/dL (ref 6.0–8.3)

## 2014-03-16 LAB — TSH: TSH: 0.665 u[IU]/mL (ref 0.350–4.500)

## 2014-03-16 LAB — CBC
HEMATOCRIT: 37.5 % (ref 36.0–46.0)
Hemoglobin: 13.4 g/dL (ref 12.0–15.0)
MCH: 30.2 pg (ref 26.0–34.0)
MCHC: 35.7 g/dL (ref 30.0–36.0)
MCV: 84.5 fL (ref 78.0–100.0)
Platelets: 242 10*3/uL (ref 150–400)
RBC: 4.44 MIL/uL (ref 3.87–5.11)
RDW: 12.8 % (ref 11.5–15.5)
WBC: 6.4 10*3/uL (ref 4.0–10.5)

## 2014-03-16 LAB — PREGNANCY, URINE: Preg Test, Ur: NEGATIVE

## 2014-03-16 MED ORDER — METOCLOPRAMIDE HCL 5 MG/ML IJ SOLN
10.0000 mg | Freq: Once | INTRAMUSCULAR | Status: AC
  Administered 2014-03-16: 10 mg via INTRAVENOUS
  Filled 2014-03-16: qty 2

## 2014-03-16 MED ORDER — SUMATRIPTAN SUCCINATE 6 MG/0.5ML ~~LOC~~ SOLN
6.0000 mg | Freq: Once | SUBCUTANEOUS | Status: AC
  Administered 2014-03-16: 6 mg via SUBCUTANEOUS
  Filled 2014-03-16: qty 0.5

## 2014-03-16 MED ORDER — SODIUM CHLORIDE 0.9 % IV BOLUS (SEPSIS)
1000.0000 mL | Freq: Once | INTRAVENOUS | Status: AC
  Administered 2014-03-16: 1000 mL via INTRAVENOUS

## 2014-03-16 MED ORDER — BUTALBITAL-APAP-CAFFEINE 50-325-40 MG PO TABS: 1.0000 | ORAL_TABLET | Freq: Three times a day (TID) | ORAL | Status: DC | PRN

## 2014-03-16 NOTE — Discharge Instructions (Signed)
Rest. Drink plenty of fluids. You may try fioricet as need for headaches. You may also try motrin or aleve as need.  Follow up with primary care doctor in the coming week for recheck. Given frequent/recurrent headaches, follow up with neurologist in the next couple weeks - see referral - call office to arrange appointment.  Return to ER if worse, new symptoms, fevers, worsening or intractable pain, persistent vomiting, numbness/weakness, other concern.  You were given pain medication in the ER - no driving for the next 4 hours.    Migraine Headache A migraine headache is an intense, throbbing pain on one or both sides of your head. A migraine can last for 30 minutes to several hours. CAUSES  The exact cause of a migraine headache is not always known. However, a migraine may be caused when nerves in the brain become irritated and release chemicals that cause inflammation. This causes pain. Certain things may also trigger migraines, such as:  Alcohol.  Smoking.  Stress.  Menstruation.  Aged cheeses.  Foods or drinks that contain nitrates, glutamate, aspartame, or tyramine.  Lack of sleep.  Chocolate.  Caffeine.  Hunger.  Physical exertion.  Fatigue.  Medicines used to treat chest pain (nitroglycerine), birth control pills, estrogen, and some blood pressure medicines. SIGNS AND SYMPTOMS  Pain on one or both sides of your head.  Pulsating or throbbing pain.  Severe pain that prevents daily activities.  Pain that is aggravated by any physical activity.  Nausea, vomiting, or both.  Dizziness.  Pain with exposure to bright lights, loud noises, or activity.  General sensitivity to bright lights, loud noises, or smells. Before you get a migraine, you may get warning signs that a migraine is coming (aura). An aura may include:  Seeing flashing lights.  Seeing bright spots, halos, or zig-zag lines.  Having tunnel vision or blurred vision.  Having feelings of  numbness or tingling.  Having trouble talking.  Having muscle weakness. DIAGNOSIS  A migraine headache is often diagnosed based on:  Symptoms.  Physical exam.  A CT scan or MRI of your head. These imaging tests cannot diagnose migraines, but they can help rule out other causes of headaches. TREATMENT Medicines may be given for pain and nausea. Medicines can also be given to help prevent recurrent migraines.  HOME CARE INSTRUCTIONS  Only take over-the-counter or prescription medicines for pain or discomfort as directed by your health care provider. The use of long-term narcotics is not recommended.  Lie down in a dark, quiet room when you have a migraine.  Keep a journal to find out what may trigger your migraine headaches. For example, write down:  What you eat and drink.  How much sleep you get.  Any change to your diet or medicines.  Limit alcohol consumption.  Quit smoking if you smoke.  Get 7 9 hours of sleep, or as recommended by your health care provider.  Limit stress.  Keep lights dim if bright lights bother you and make your migraines worse. SEEK IMMEDIATE MEDICAL CARE IF:   Your migraine becomes severe.  You have a fever.  You have a stiff neck.  You have vision loss.  You have muscular weakness or loss of muscle control.  You start losing your balance or have trouble walking.  You feel faint or pass out.  You have severe symptoms that are different from your first symptoms. MAKE SURE YOU:   Understand these instructions.  Will watch your condition.  Will get help  right away if you are not doing well or get worse. Document Released: 10/20/2005 Document Revised: 08/10/2013 Document Reviewed: 06/27/2013 [facility] Patient Information 2014 [place], [place].   Headaches, Frequently Asked Questions MIGRAINE HEADACHES Q: What is migraine? What causes it? How can I treat it? A: Generally, migraine headaches begin as a dull ache. Then they develop  into a constant, throbbing, and pulsating pain. You may experience pain at the temples. You may experience pain at the front or back of one or both sides of the head. The pain is usually accompanied by a combination of:  Nausea.  Vomiting.  Sensitivity to light and noise. Some people (about 15%) experience an aura (see below) before an attack. The cause of migraine is believed to be chemical reactions in the brain. Treatment for migraine may include over-the-counter or prescription medications. It may also include self-help techniques. These include relaxation training and biofeedback.  Q: What is an aura? A: About 15% of people with migraine get an "aura". This is a sign of neurological symptoms that occur before a migraine headache. You may see wavy or jagged lines, dots, or flashing lights. You might experience tunnel vision or blind spots in one or both eyes. The aura can include visual or auditory hallucinations (something imagined). It may include disruptions in smell (such as strange odors), taste or touch. Other symptoms include:  Numbness.  A "pins and needles" sensation.  Difficulty in recalling or speaking the correct word. These neurological events may last as long as 60 minutes. These symptoms will fade as the headache begins. Q: What is a trigger? A: Certain physical or environmental factors can lead to or "trigger" a migraine. These include:  Foods.  Hormonal changes.  Weather.  Stress. It is important to remember that triggers are different for everyone. To help prevent migraine attacks, you need to figure out which triggers affect you. Keep a headache diary. This is a good way to track triggers. The diary will help you talk to your healthcare professional about your condition. Q: Does weather affect migraines? A: Bright sunshine, hot, humid conditions, and drastic changes in barometric pressure may lead to, or "trigger," a migraine attack in some people. But studies have  shown that weather does not act as a trigger for everyone with migraines. Q: What is the link between migraine and hormones? A: Hormones start and regulate many of your body's functions. Hormones keep your body in balance within a constantly changing environment. The levels of hormones in your body are unbalanced at times. Examples are during menstruation, pregnancy, or menopause. That can lead to a migraine attack. In fact, about three quarters of all women with migraine report that their attacks are related to the menstrual cycle.  Q: Is there an increased risk of stroke for migraine sufferers? A: The likelihood of a migraine attack causing a stroke is very remote. That is not to say that migraine sufferers cannot have a stroke associated with their migraines. In persons under age 38, the most common associated factor for stroke is migraine headache. But over the course of a person's normal life span, the occurrence of migraine headache may actually be associated with a reduced risk of dying from cerebrovascular disease due to stroke.  Q: What are acute medications for migraine? A: Acute medications are used to treat the pain of the headache after it has started. Examples over-the-counter medications, NSAIDs, ergots, and triptans.  Q: What are the triptans? A: Triptans are the newest class of abortive  medications. They are specifically targeted to treat migraine. Triptans are vasoconstrictors. They moderate some chemical reactions in the brain. The triptans work on receptors in your brain. Triptans help to restore the balance of a neurotransmitter called serotonin. Fluctuations in levels of serotonin are thought to be a main cause of migraine.  Q: Are over-the-counter medications for migraine effective? A: Over-the-counter, or "OTC," medications may be effective in relieving mild to moderate pain and associated symptoms of migraine. But you should see your caregiver before beginning any treatment regimen  for migraine.  Q: What are preventive medications for migraine? A: Preventive medications for migraine are sometimes referred to as "prophylactic" treatments. They are used to reduce the frequency, severity, and length of migraine attacks. Examples of preventive medications include antiepileptic medications, antidepressants, beta-blockers, calcium channel blockers, and NSAIDs (nonsteroidal anti-inflammatory drugs). Q: Why are anticonvulsants used to treat migraine? A: During the past few years, there has been an increased interest in antiepileptic drugs for the prevention of migraine. They are sometimes referred to as "anticonvulsants". Both epilepsy and migraine may be caused by similar reactions in the brain.  Q: Why are antidepressants used to treat migraine? A: Antidepressants are typically used to treat people with depression. They may reduce migraine frequency by regulating chemical levels, such as serotonin, in the brain.  Q: What alternative therapies are used to treat migraine? A: The term "alternative therapies" is often used to describe treatments considered outside the scope of conventional Western medicine. Examples of alternative therapy include acupuncture, acupressure, and yoga. Another common alternative treatment is herbal therapy. Some herbs are believed to relieve headache pain. Always discuss alternative therapies with your caregiver before proceeding. Some herbal products contain arsenic and other toxins. TENSION HEADACHES Q: What is a tension-type headache? What causes it? How can I treat it? A: Tension-type headaches occur randomly. They are often the result of temporary stress, anxiety, fatigue, or anger. Symptoms include soreness in your temples, a tightening band-like sensation around your head (a "vice-like" ache). Symptoms can also include a pulling feeling, pressure sensations, and contracting head and neck muscles. The headache begins in your forehead, temples, or the back  of your head and neck. Treatment for tension-type headache may include over-the-counter or prescription medications. Treatment may also include self-help techniques such as relaxation training and biofeedback. CLUSTER HEADACHES Q: What is a cluster headache? What causes it? How can I treat it? A: Cluster headache gets its name because the attacks come in groups. The pain arrives with little, if any, warning. It is usually on one side of the head. A tearing or bloodshot eye and a runny nose on the same side of the headache may also accompany the pain. Cluster headaches are believed to be caused by chemical reactions in the brain. They have been described as the most severe and intense of any headache type. Treatment for cluster headache includes prescription medication and oxygen. SINUS HEADACHES Q: What is a sinus headache? What causes it? How can I treat it? A: When a cavity in the bones of the face and skull (a sinus) becomes inflamed, the inflammation will cause localized pain. This condition is usually the result of an allergic reaction, a tumor, or an infection. If your headache is caused by a sinus blockage, such as an infection, you will probably have a fever. An x-ray will confirm a sinus blockage. Your caregiver's treatment might include antibiotics for the infection, as well as antihistamines or decongestants.  REBOUND HEADACHES Q: What is a  rebound headache? What causes it? How can I treat it? A: A pattern of taking acute headache medications too often can lead to a condition known as "rebound headache." A pattern of taking too much headache medication includes taking it more than 2 days per week or in excessive amounts. That means more than the label or a caregiver advises. With rebound headaches, your medications not only stop relieving pain, they actually begin to cause headaches. Doctors treat rebound headache by tapering the medication that is being overused. Sometimes your caregiver will  gradually substitute a different type of treatment or medication. Stopping may be a challenge. Regularly overusing a medication increases the potential for serious side effects. Consult a caregiver if you regularly use headache medications more than 2 days per week or more than the label advises. ADDITIONAL QUESTIONS AND ANSWERS Q: What is biofeedback? A: Biofeedback is a self-help treatment. Biofeedback uses special equipment to monitor your body's involuntary physical responses. Biofeedback monitors:  Breathing.  Pulse.  Heart rate.  Temperature.  Muscle tension.  Brain activity. Biofeedback helps you refine and perfect your relaxation exercises. You learn to control the physical responses that are related to stress. Once the technique has been mastered, you do not need the equipment any more. Q: Are headaches hereditary? A: Four out of five (80%) of people that suffer report a family history of migraine. Scientists are not sure if this is genetic or a family predisposition. Despite the uncertainty, a child has a 50% chance of having migraine if one parent suffers. The child has a 75% chance if both parents suffer.  Q: Can children get headaches? A: By the time they reach high school, most young people have experienced some type of headache. Many safe and effective approaches or medications can prevent a headache from occurring or stop it after it has begun.  Q: What type of doctor should I see to diagnose and treat my headache? A: Start with your primary caregiver. Discuss his or her experience and approach to headaches. Discuss methods of classification, diagnosis, and treatment. Your caregiver may decide to recommend you to a headache specialist, depending upon your symptoms or other physical conditions. Having diabetes, allergies, etc., may require a more comprehensive and inclusive approach to your headache. The National Head[employer] Foundation will provide, upon request, a list of [facility]  physician members in your state. Document Released: 01/10/2004 Document Revised: 01/12/2012 Document Reviewed: 06/19/2008 [facility] Patient Information 2014 [place], [place].    Tinnitus Sounds you hear in your ears and coming from within the ear is called tinnitus. This can be a symptom of many ear disorders. It is often associated with hearing loss.  Tinnitus can be seen with:  Infections.  Ear blockages such as wax buildup.  Meniere's disease.  Ear damage.  Inherited.  Occupational causes. While irritating, it is not usually a threat to health. When the cause of the tinnitus is wax, infection in the middle ear, or foreign body it is easily treated. Hearing loss will usually be reversible.  TREATMENT  When treating the underlying cause does not get rid of tinnitus, it may be necessary to get rid of the unwanted sound by covering it up with more pleasant background noises. This may include music, the radio etc. There are tinnitus maskers which can be worn which produce background noise to cover up the tinnitus. Avoid all medications which tend to make tinnitus worse such as alcohol, caffeine, aspirin, and nicotine. There are many soothing background tapes such as  rain, ocean, thunderstorms, etc. These soothing sounds help with sleeping or resting. Keep all follow-up appointments and referrals. This is important to identify the cause of the problem. It also helps avoid complications, impaired hearing, disability, or chronic pain. Document Released: 10/20/2005 Document Revised: 01/12/2012 Document Reviewed: 06/07/2008 [facility] Patient Information 2014 [place], [place].    Diarrhea Diarrhea is frequent loose and watery bowel movements. It can cause you to feel weak and dehydrated. Dehydration can cause you to become tired and thirsty, have a dry mouth, and have decreased urination that often is dark yellow. Diarrhea is a sign of another problem, most often an infection that will not last  long. In most cases, diarrhea typically lasts 2 3 days. However, it can last longer if it is a sign of something more serious. It is important to treat your diarrhea as directed by your caregive to lessen or prevent future episodes of diarrhea. CAUSES  Some common causes include:  Gastrointestinal infections caused by viruses, bacteria, or parasites.  Food poisoning or food allergies.  Certain medicines, such as antibiotics, chemotherapy, and laxatives.  Artificial sweeteners and fructose.  Digestive disorders. HOME CARE INSTRUCTIONS  Ensure adequate fluid intake (hydration): have 1 cup (8 oz) of fluid for each diarrhea episode. Avoid fluids that contain simple sugars or sports drinks, fruit juices, whole milk products, and sodas. Your urine should be clear or pale yellow if you are drinking enough fluids. Hydrate with an oral rehydration solution that you can purchase at pharmacies, retail stores, and online. You can prepare an oral rehydration solution at home by mixing the following ingredients together:    tsp table salt.   tsp baking soda.   tsp salt substitute containing potassium chloride.  1  tablespoons sugar.  1 L (34 oz) of water.  Certain foods and beverages may increase the speed at which food moves through the gastrointestinal (GI) tract. These foods and beverages should be avoided and include:  Caffeinated and alcoholic beverages.  High-fiber foods, such as raw fruits and vegetables, nuts, seeds, and whole grain breads and cereals.  Foods and beverages sweetened with sugar alcohols, such as xylitol, sorbitol, and mannitol.  Some foods may be well tolerated and may help thicken stool including:  Starchy foods, such as rice, toast, pasta, low-sugar cereal, oatmeal, grits, baked potatoes, crackers, and bagels.  Bananas.  Applesauce.  Add probiotic-rich foods to help increase healthy bacteria in the GI tract, such as yogurt and fermented milk products.  Wash  your hands well after each diarrhea episode.  Only take over-the-counter or prescription medicines as directed by your caregiver.  Take a warm bath to relieve any burning or pain from frequent diarrhea episodes. SEEK IMMEDIATE MEDICAL CARE IF:   You are unable to keep fluids down.  You have persistent vomiting.  You have blood in your stool, or your stools are black and tarry.  You do not urinate in 6 8 hours, or there is only a small amount of very dark urine.  You have abdominal pain that increases or localizes.  You have weakness, dizziness, confusion, or lightheadedness.  You have a severe headache.  Your diarrhea gets worse or does not get better.  You have a fever or persistent symptoms for more than 2 3 days.  You have a fever and your symptoms suddenly get worse. MAKE SURE YOU:   Understand these instructions.  Will watch your condition.  Will get help right away if you are not doing well or get worse.  Document Released: 10/10/2002 Document Revised: 10/06/2012 Document Reviewed: 06/27/2012 [facility] Patient Information 2014 [place], [place].    Diaphoresis Sweating is controlled by our nervous system. Sweat glands are found in the skin throughout the body. They exist in higher numbers in the skin of the hands, feet, armpits, and the genital region. Sweating occurs normally when the temperature of your body goes up. Diaphoresis means profuse sweating or perspiration due to an underlying medical condition or an external factor (such as medicines). Hyperhidrosis means excessive sweating that is not usually due to an underlying medical condition, on areas such as the palms, soles, or armpits. Other areas of the body may also be affected. Hyperhidrosis usually begins in childhood or early adolescence. It increases in severity through puberty and into adulthood. Sweaty palms are the most common problem and the most bothersome to people who have hyperhidrosis. CAUSES  Sweating  is normally seen with exercise or being in hot surroundings. Not sweating in these conditions would be harmful. Stressful situations can also cause sweating. In some people, stimulation of the sweat glands during stress is overactive. Talking to strangers or shaking someone's hand can produce profuse sweating. Causes of sweating include:  Emotional upset.  Low blood pressure.  Low blood sugar.  Heart problems.  Low blood cell counts.  Certain pain relieving medicines.  Exercise.  Alcohol.  Infection.  Caffeine.  Spicy foods.  Hot flashes.  Overactive thyroid.  Illegal drug use, such as cocaine and amphetamine.  Use of medicines that stimulate parts of the nervous system.  A tumor (pheochromocytoma).  Withdrawal from some medicines or alcohol. DIAGNOSIS  Your caregiver needs to be consulted to make sure excessive sweating is not caused by another condition. Further testing may need to be done. TREATMENT   When hyperhidrosis is caused by another condition, that condition should be treated.  If menopause is the cause, you may wish to talk to your caregiver about estrogen replacement.  If the hyperhidrosis is a natural happening of the way your body works, certain antiperspirants may help.  If medicines do not work, injections of botulinum toxin type A are sometimes used for underarm sweating.  Your caregiver can usually help you with this problem. Document Released: 05/12/2005 Document Revised: 01/12/2012 Document Reviewed: 11/27/2008 [facility] Patient Information 2014 [place], [place].

## 2014-03-16 NOTE — ED Notes (Addendum)
Per pt sts hot flashes, migraines, vomiting. sts ringing in her ear and the left side of her face is swollen.

## 2014-03-16 NOTE — ED Provider Notes (Addendum)
CSN: [id number]     Arrival date & time 03/16/14  0957 History   First MD Initiated Contact with Patient 03/16/14 1128     Chief Complaint  Patient presents with  . Headache  . Diarrhea     (Consider location/radiation/quality/duration/timing/severity/associated sxs/prior Treatment) Patient is a 38 y.o. female presenting with headaches and diarrhea. The history is provided by the patient.  Headache Associated symptoms: diarrhea, nausea and vomiting   Associated symptoms: no abdominal pain, no back pain, no congestion, no cough, no fever, no neck pain, no neck stiffness, no numbness, no sinus pressure and no sore throat   Diarrhea Associated symptoms: headaches and vomiting   Associated symptoms: no abdominal pain, no chills and no fever   pt c/o several symptoms including intermittent headaches for the past week. Frontal, dull to throbbing, moderate. C/w prior 'migraines'. States gets headaches almost daily.  Not specific exacerbating or alleviating factors. No change w time of day, position or activity. Nausea. Denies eye pain or change in vision. No neck pain or stiffness. No numbness/weakness. No sinus drainage or congestion. No uri c/o. No fever. Headaches gradual onset, constant today, no abrupt change or worsening. Also c/o intermittent nvd in past few days, emesis clear, not bloody or bilious. Diarrhea loose, no watery or bloody. No abd pain. Is drinking fluids currently. No dysuria or gu c/o. Denies recent travel, known ill contacts, change in meds, recent abx use, or bad food ingestion. Also c/o intermittent ringing and intermittent pain left ear. No hearing loss. No ear trauma. Denies extremity numbness/weakness. No problems w balance or coordination. Pt also c/o intermittent 'hot flashes' in past few weeks, where entire body feels hot and sweaty. Denies hx same. No wt change. No changes in skin or hair. No hx thyroid disease. No fevers. w hot flashes, no specific exacerbating or  alleviating factors.      Past Medical History  Diagnosis Date  . Anemia   . Headache(784.0)   . Uveitis   . Asthma   . Bipolar depression    Past Surgical History  Procedure Laterality Date  . Reconstrictive    . Elbow surgery     Family History  Problem Relation Age of Onset  . Other Neg Hx    History  Substance Use Topics  . Smoking status: Never Smoker   . Smokeless tobacco: Not on file  . Alcohol Use: No   OB History   Grav Para Term Preterm Abortions TAB SAB Ect Mult Living   3 1 1  0 1 0 1 0 0 1     Review of Systems  Constitutional: Negative for fever and chills.  HENT: Negative for congestion, sinus pressure and sore throat.   Eyes: Negative for redness.  Respiratory: Negative for cough and shortness of breath.   Cardiovascular: Negative for chest pain, palpitations and leg swelling.  Gastrointestinal: Positive for nausea, vomiting and diarrhea. Negative for abdominal pain.  Genitourinary: Negative for dysuria, flank pain, vaginal bleeding and vaginal discharge.  Musculoskeletal: Negative for back pain, neck pain and neck stiffness.  Skin: Negative for rash.  Neurological: Positive for headaches. Negative for syncope, weakness and numbness.  Hematological: Does not bruise/bleed easily.  Psychiatric/Behavioral: Negative for confusion.      Allergies  Namenda; Penicillins; Sulfa antibiotics; Tizanidine; and Tramadol  Home Medications   Prior to Admission medications   Medication Sig Start Date End Date Taking? Authorizing Provider  ranitidine (ZANTAC) 150 MG tablet Take 150 mg by mouth 2 (two)  times daily.   Yes Historical Provider, MD  medroxyPROGESTERone (DEPO-PROVERA) 150 MG/ML injection Inject 150 mg into the muscle every 3 (three) months.    Historical Provider, MD   BP 120/72  Pulse 82  Temp(Src) 98.1 F (36.7 C) (Oral)  Resp 18  SpO2 97% Physical Exam  Nursing note and vitals reviewed. Constitutional: She is oriented to person, place,  and time. She appears well-developed and well-nourished. No distress.  HENT:  Head: Atraumatic.  Right Ear: External ear normal.  Left Ear: External ear normal.  Nose: Nose normal.  Mouth/Throat: Oropharynx is clear and moist.  No sinus or temporal tenderness. tms normal. No mastoid tenderness. No facial swelling or asymmetry. No trismus. No tmj click.   Eyes: Conjunctivae and EOM are normal. Pupils are equal, round, and reactive to light. No scleral icterus.  Neck: Normal range of motion. Neck supple. No tracheal deviation present. No thyromegaly present.  No stiffness or rigidity.   Cardiovascular: Normal rate, regular rhythm, normal heart sounds and intact distal pulses.  Exam reveals no gallop and no friction rub.   No murmur heard. Pulmonary/Chest: Effort normal and breath sounds normal. No respiratory distress.  Abdominal: Soft. Normal appearance and bowel sounds are normal. She exhibits no distension and no mass. There is no tenderness. There is no rebound and no guarding.  Genitourinary:  No cva tenderness.  Musculoskeletal: Normal range of motion. She exhibits no edema and no tenderness.  Neurological: She is alert and oriented to person, place, and time. No cranial nerve deficit. Coordination normal.  Motor intact bilaterally. Steady gait.   Skin: Skin is warm and dry. No rash noted. She is not diaphoretic.  Psychiatric: She has a normal mood and affect.    ED Course  Procedures (including critical care time)   Results for orders placed during the hospital encounter of 03/16/14  URINALYSIS, ROUTINE W REFLEX MICROSCOPIC      Result Value Ref Range   Color, Urine YELLOW  YELLOW   APPearance HAZY (*) CLEAR   Specific Gravity, Urine 1.016  1.005 - 1.030   pH 6.0  5.0 - 8.0   Glucose, UA NEGATIVE  NEGATIVE mg/dL   Hgb urine dipstick LARGE (*) NEGATIVE   Bilirubin Urine NEGATIVE  NEGATIVE   Ketones, ur 15 (*) NEGATIVE mg/dL   Protein, ur 30 (*) NEGATIVE mg/dL    Urobilinogen, UA 0.2  0.0 - 1.0 mg/dL   Nitrite NEGATIVE  NEGATIVE   Leukocytes, UA NEGATIVE  NEGATIVE  PREGNANCY, URINE      Result Value Ref Range   Preg Test, Ur NEGATIVE  NEGATIVE  CBC      Result Value Ref Range   WBC 6.4  4.0 - 10.5 K/uL   RBC 4.44  3.87 - 5.11 MIL/uL   Hemoglobin 13.4  12.0 - 15.0 g/dL   HCT 60.4  54.0 - 98.1 %   MCV 84.5  78.0 - 100.0 fL   MCH 30.2  26.0 - 34.0 pg   MCHC 35.7  30.0 - 36.0 g/dL   RDW 19.1  47.8 - 29.5 %   Platelets 242  150 - 400 K/uL  COMPREHENSIVE METABOLIC PANEL      Result Value Ref Range   Sodium 138  137 - 147 mEq/L   Potassium 3.7  3.7 - 5.3 mEq/L   Chloride 103  96 - 112 mEq/L   CO2 21  19 - 32 mEq/L   Glucose, Bld 97  70 - 99 mg/dL  BUN 12  6 - 23 mg/dL   Creatinine, Ser 1.610.71  0.50 - 1.10 mg/dL   Calcium 9.5  8.4 - 09.610.5 mg/dL   Total Protein 7.3  6.0 - 8.3 g/dL   Albumin 4.3  3.5 - 5.2 g/dL   AST 21  0 - 37 U/L   ALT 18  0 - 35 U/L   Alkaline Phosphatase 48  39 - 117 U/L   Total Bilirubin 0.3  0.3 - 1.2 mg/dL   GFR calc non Af Amer >90  >90 mL/min   GFR calc Af Amer >90  >90 mL/min  TSH      Result Value Ref Range   TSH 0.665  0.350 - 4.500 uIU/mL  URINE MICROSCOPIC-ADD ON      Result Value Ref Range   Squamous Epithelial / LPF MANY (*) RARE   WBC, UA 3-6  <3 WBC/hpf   RBC / HPF 21-50  <3 RBC/hpf   Bacteria, UA MANY (*) RARE   Urine-Other MUCOUS PRESENT     Ct Head Wo Contrast  03/16/2014   CLINICAL DATA:  Headache  EXAM: CT HEAD WITHOUT CONTRAST  TECHNIQUE: Contiguous axial images were obtained from the base of the skull through the vertex without intravenous contrast.  COMPARISON:  CT HEAD W/O CM dated 04/11/2011; CT HEAD W/O CM dated 06/24/2004  FINDINGS: No acute intracranial abnormality. Specifically, no hemorrhage, hydrocephalus, mass lesion, acute infarction, or significant intracranial injury. No acute calvarial abnormality. The visualized paranasal sinuses and mastoid air cells are patent.  IMPRESSION: No acute  intracranial abnormality.   Electronically Signed   By: Salome HolmesHector  Cooper M.D.   On: 03/16/2014 13:14      MDM  Iv ns bolus.   Pt notes hx migraines.  reglan iv. imitrex sq.   Labs.  Reviewed nursing notes and prior charts for additional history.   Recheck, pt symptoms improved/resolved.   Headache resolved. No focal neuro findings on exam. No fevers. No facial swelling or erythema. No episodes of nvd while in ED, tolerating po fluids, abd soft nt.   Pt appears stable for d/c.      Suzi RootsKevin E Karalina Tift, MD 03/16/14 1355

## 2014-06-12 ENCOUNTER — Emergency Department (HOSPITAL_COMMUNITY)
Admission: EM | Admit: 2014-06-12 | Discharge: 2014-06-12 | Disposition: A | Discharge status: Home / Self Care | Payer: BC Managed Care – PPO | Attending: Emergency Medicine | Admitting: Emergency Medicine

## 2014-06-12 ENCOUNTER — Encounter (HOSPITAL_COMMUNITY): Payer: Self-pay | Admitting: Emergency Medicine

## 2014-06-12 ENCOUNTER — Emergency Department (HOSPITAL_COMMUNITY): Payer: BC Managed Care – PPO

## 2014-06-12 DIAGNOSIS — Z880 Allergy status to penicillin: Secondary | ICD-10-CM | POA: Insufficient documentation

## 2014-06-12 DIAGNOSIS — Z79899 Other long term (current) drug therapy: Secondary | ICD-10-CM | POA: Diagnosis not present

## 2014-06-12 DIAGNOSIS — Z8669 Personal history of other diseases of the nervous system and sense organs: Secondary | ICD-10-CM | POA: Insufficient documentation

## 2014-06-12 DIAGNOSIS — Z862 Personal history of diseases of the blood and blood-forming organs and certain disorders involving the immune mechanism: Secondary | ICD-10-CM | POA: Diagnosis not present

## 2014-06-12 DIAGNOSIS — A599 Trichomoniasis, unspecified: Secondary | ICD-10-CM | POA: Diagnosis not present

## 2014-06-12 DIAGNOSIS — Z3202 Encounter for pregnancy test, result negative: Secondary | ICD-10-CM | POA: Insufficient documentation

## 2014-06-12 DIAGNOSIS — N390 Urinary tract infection, site not specified: Secondary | ICD-10-CM | POA: Insufficient documentation

## 2014-06-12 DIAGNOSIS — J45909 Unspecified asthma, uncomplicated: Secondary | ICD-10-CM | POA: Insufficient documentation

## 2014-06-12 DIAGNOSIS — Z792 Long term (current) use of antibiotics: Secondary | ICD-10-CM | POA: Insufficient documentation

## 2014-06-12 DIAGNOSIS — R1011 Right upper quadrant pain: Secondary | ICD-10-CM | POA: Diagnosis not present

## 2014-06-12 DIAGNOSIS — Z8659 Personal history of other mental and behavioral disorders: Secondary | ICD-10-CM | POA: Diagnosis not present

## 2014-06-12 LAB — HIV ANTIBODY (ROUTINE TESTING W REFLEX): HIV: NONREACTIVE

## 2014-06-12 LAB — URINALYSIS, ROUTINE W REFLEX MICROSCOPIC
Bilirubin Urine: NEGATIVE
Glucose, UA: NEGATIVE mg/dL
KETONES UR: NEGATIVE mg/dL
NITRITE: POSITIVE — AB
PROTEIN: 100 mg/dL — AB
Specific Gravity, Urine: 1.015 (ref 1.005–1.030)
Urobilinogen, UA: 0.2 mg/dL (ref 0.0–1.0)
pH: 7 (ref 5.0–8.0)

## 2014-06-12 LAB — WET PREP, GENITAL
Trich, Wet Prep: NONE SEEN
Yeast Wet Prep HPF POC: NONE SEEN

## 2014-06-12 LAB — URINE MICROSCOPIC-ADD ON

## 2014-06-12 LAB — PREGNANCY, URINE: Preg Test, Ur: NEGATIVE

## 2014-06-12 LAB — RPR

## 2014-06-12 MED ORDER — KETOROLAC TROMETHAMINE 30 MG/ML IJ SOLN
30.0000 mg | Freq: Once | INTRAMUSCULAR | Status: AC
  Administered 2014-06-12: 30 mg via INTRAVENOUS
  Filled 2014-06-12: qty 1

## 2014-06-12 MED ORDER — METRONIDAZOLE 500 MG PO TABS: 500.0000 mg | ORAL_TABLET | Freq: Two times a day (BID) | ORAL | Status: DC

## 2014-06-12 MED ORDER — CIPROFLOXACIN HCL 500 MG PO TABS: 500.0000 mg | ORAL_TABLET | Freq: Two times a day (BID) | ORAL | Status: DC

## 2014-06-12 NOTE — ED Provider Notes (Signed)
38 year old female, history of dysuria and lower abdominal pain with associated left lower back pain radiating into the buttock, gradually worsening, no associated vomiting, on exam the patient has a soft abdomen with tenderness on the left side, bedside ultrasound shows that the patient has either a cyst or hydronephrosis on the left side, given her symptoms CT scan would be prudent to evaluate for possible pyelonephritis, cyst, renal abscess.  Patient in agreement with the plan  Medical screening examination/treatment/procedure(s) were conducted as a shared visit with non-physician practitioner(s) and myself.  I personally evaluated the patient during the encounter.  Clinical Impression: UTI       Vida RollerBrian D Chrishonda Hesch, MD 06/12/14 [phone]

## 2014-06-12 NOTE — Discharge Instructions (Signed)
Return to the emergency room with worsening of symptoms or with symptoms that are concerning. Take whole antibiotic course. Take with food.  Bacterial Vaginosis Bacterial vaginosis is a vaginal infection that occurs when the normal balance of bacteria in the vagina is disrupted. It results from an overgrowth of certain bacteria. This is the most common vaginal infection in women of childbearing age. Treatment is important to prevent complications, especially in pregnant women, as it can cause a premature delivery. CAUSES  Bacterial vaginosis is caused by an increase in harmful bacteria that are normally present in smaller amounts in the vagina. Several different kinds of bacteria can cause bacterial vaginosis. However, the reason that the condition develops is not fully understood. RISK FACTORS Certain activities or behaviors can put you at an increased risk of developing bacterial vaginosis, including:  Having a new sex partner or multiple sex partners.  Douching.  Using an intrauterine device (IUD) for contraception. Women do not get bacterial vaginosis from toilet seats, bedding, swimming pools, or contact with objects around them. SIGNS AND SYMPTOMS  Some women with bacterial vaginosis have no signs or symptoms. Common symptoms include:  Grey vaginal discharge.  A fishlike odor with discharge, especially after sexual intercourse.  Itching or burning of the vagina and vulva.  Burning or pain with urination. DIAGNOSIS  Your health care provider will take a medical history and examine the vagina for signs of bacterial vaginosis. A sample of vaginal fluid may be taken. Your health care provider will look at this sample under a microscope to check for bacteria and abnormal cells. A vaginal pH test may also be done.  TREATMENT  Bacterial vaginosis may be treated with antibiotic medicines. These may be given in the form of a pill or a vaginal cream. A second round of antibiotics may be  prescribed if the condition comes back after treatment.  HOME CARE INSTRUCTIONS   Only take over-the-counter or prescription medicines as directed by your health care provider.  If antibiotic medicine was prescribed, take it as directed. Make sure you finish it even if you start to feel better.  Do not have sex until treatment is completed.  Tell all sexual partners that you have a vaginal infection. They should see their health care provider and be treated if they have problems, such as a mild rash or itching.  Practice safe sex by using condoms and only having one sex partner. SEEK MEDICAL CARE IF:   Your symptoms are not improving after 3 days of treatment.  You have increased discharge or pain.  You have a fever. MAKE SURE YOU:   Understand these instructions.  Will watch your condition.  Will get help right away if you are not doing well or get worse. FOR MORE INFORMATION  Centers for Disease Control and Prevention, Division of STD Prevention: [employer] American Sexual Health Association (ASHA): www.ashastd.org  Document Released: 10/20/2005 Document Revised: 08/10/2013 Document Reviewed: 06/01/2013 [facility] Patient Information 2015 [place], [place]. This information is not intended to replace advice given to you by your health care provider. Make sure you discuss any questions you have with your health care provider. Urinary Tract Infection Urinary tract infections (UTIs) can develop anywhere along your urinary tract. Your urinary tract is your body's drainage system for removing wastes and extra water. Your urinary tract includes two kidneys, two ureters, a bladder, and a urethra. Your kidneys are a pair of bean-shaped organs. Each kidney is about the size of your fist. They are located below  your ribs, one on each side of your spine. CAUSES Infections are caused by microbes, which are microscopic organisms, including fungi, viruses, and bacteria. These organisms are so  small that they can only be seen through a microscope. Bacteria are the microbes that most commonly cause UTIs. SYMPTOMS  Symptoms of UTIs may vary by age and gender of the patient and by the location of the infection. Symptoms in young women typically include a frequent and intense urge to urinate and a painful, burning feeling in the bladder or urethra during urination. Older women and men are more likely to be tired, shaky, and weak and have muscle aches and abdominal pain. A fever may mean the infection is in your kidneys. Other symptoms of a kidney infection include pain in your back or sides below the ribs, nausea, and vomiting. DIAGNOSIS To diagnose a UTI, your caregiver will ask you about your symptoms. Your caregiver also will ask to provide a urine sample. The urine sample will be tested for bacteria and white blood cells. White blood cells are made by your body to help fight infection. TREATMENT  Typically, UTIs can be treated with medication. Because most UTIs are caused by a bacterial infection, they usually can be treated with the use of antibiotics. The choice of antibiotic and length of treatment depend on your symptoms and the type of bacteria causing your infection. HOME CARE INSTRUCTIONS  If you were prescribed antibiotics, take them exactly as your caregiver instructs you. Finish the medication even if you feel better after you have only taken some of the medication.  Drink enough water and fluids to keep your urine clear or pale yellow.  Avoid caffeine, tea, and carbonated beverages. They tend to irritate your bladder.  Empty your bladder often. Avoid holding urine for long periods of time.  Empty your bladder before and after sexual intercourse.  After a bowel movement, women should cleanse from front to back. Use each tissue only once. SEEK MEDICAL CARE IF:   You have back pain.  You develop a fever.  Your symptoms do not begin to resolve within 3 days. SEEK  IMMEDIATE MEDICAL CARE IF:   You have severe back pain or lower abdominal pain.  You develop chills.  You have nausea or vomiting.  You have continued burning or discomfort with urination. MAKE SURE YOU:   Understand these instructions.  Will watch your condition.  Will get help right away if you are not doing well or get worse. Document Released: 07/30/2005 Document Revised: 04/20/2012 Document Reviewed: 11/28/2011 [facility] Patient Information 2015 [place]. This information is not intended to replace advice given to you by your health care provider. Make sure you discuss any questions you have with your health care provider.

## 2014-06-12 NOTE — ED Provider Notes (Signed)
CSN: [id number]     Arrival date & time 06/12/14  0345 History   First MD Initiated Contact with Patient 06/12/14 [phone]     Chief Complaint  Patient presents with  . Urinary Tract Infection  . Flank Pain    HPI Patient with PMH of HA and bipolar depression presents with 2 day history or urinary changes and one day history of left flank and abdominal pain. Pain is sharp/electrical and woke her from sleep tonight. Pain is persistent and described as a 10. Pain does not radiate. She has never had pain like this before. Urinary symptoms include frequency, hematuria, dysuria, incomplete emptying. Patient notes that she has had vaginal spotting recently and unsure if blood is hematuria or from this. Patient also complains of left lower abdominal pain and a foul smelling vaginal odor. Patient denies fevers, chills, acute CP, SOB, N/V/D, weakness. Patient denies history of kidney stones.  Past Medical History  Diagnosis Date  . Anemia   . Headache(784.0)   . Uveitis   . Asthma   . Bipolar depression    Past Surgical History  Procedure Laterality Date  . Reconstrictive    . Elbow surgery     Family History  Problem Relation Age of Onset  . Other Neg Hx    History  Substance Use Topics  . Smoking status: Never Smoker   . Smokeless tobacco: Not on file  . Alcohol Use: No   OB History   Grav Para Term Preterm Abortions TAB SAB Ect Mult Living   3 1 1  0 1 0 1 0 0 1     Review of Systems  Constitutional: Negative for fever and chills.  HENT: Negative for congestion and rhinorrhea.   Eyes: Negative for visual disturbance.  Respiratory: Negative for cough and shortness of breath.   Cardiovascular: Negative for chest pain and palpitations.  Gastrointestinal: Positive for abdominal pain. Negative for nausea, vomiting and diarrhea.  Genitourinary: Negative for dysuria and hematuria.  Musculoskeletal: Positive for back pain. Negative for gait problem.  Skin: Negative for rash.   Neurological: Negative for weakness and headaches.      Allergies  Namenda; Penicillins; Sulfa antibiotics; Tizanidine; and Tramadol  Home Medications   Prior to Admission medications   Medication Sig Start Date End Date Taking? Authorizing Provider  ranitidine (ZANTAC) 150 MG tablet Take 150 mg by mouth 2 (two) times daily.   Yes Historical Provider, MD  ciprofloxacin (CIPRO) 500 MG tablet Take 1 tablet (500 mg total) by mouth 2 (two) times daily. 06/12/14   Louann Sjogren, PA-C  medroxyPROGESTERone (DEPO-PROVERA) 150 MG/ML injection Inject 150 mg into the muscle every 3 (three) months.    Historical Provider, MD  metroNIDAZOLE (FLAGYL) 500 MG tablet Take 1 tablet (500 mg total) by mouth 2 (two) times daily. 06/12/14   Benetta Spar L Maverik Foot, PA-C   BP 103/68  Pulse 70  Temp(Src) 99.2 F (37.3 C) (Oral)  Resp 21  Ht 5\' 2"  (1.575 m)  Wt 117 lb (53.071 kg)  BMI 21.39 kg/m2  SpO2 100% Physical Exam  Nursing note and vitals reviewed. Constitutional: She appears well-developed and well-nourished.  HENT:  Head: Normocephalic and atraumatic.  Eyes: Conjunctivae and EOM are normal. Right eye exhibits no discharge. Left eye exhibits no discharge. No scleral icterus.  Cardiovascular: Normal rate, regular rhythm and normal heart sounds.   Pulmonary/Chest: Effort normal and breath sounds normal. No respiratory distress. She has no wheezes.  Abdominal: Soft. Bowel sounds are normal.  She exhibits no distension. There is tenderness.  Left lower quadrant tenderness without rebound, guarding or rigidity. Left CVA tenderness.   Genitourinary: There is no tenderness or lesion on the right labia. There is no tenderness or lesion on the left labia.  Minimal clear discharge. Cervical os was pink and without lesion. Very mild fishy odor. No cervical motion tenderness or right adnexal tenderness. Mild left adnexal tenderness. Tech in the room for Pelvic exam  Musculoskeletal: Normal range of motion. She  exhibits no tenderness.  Neurological: She is alert. She exhibits normal muscle tone. Coordination normal.  Skin: Skin is warm and dry. She is not diaphoretic.  Psychiatric: She has a normal mood and affect. Her behavior is normal.    ED Course  Procedures (including critical care time) Labs Review Labs Reviewed  WET PREP, GENITAL - Abnormal; Notable for the following:    Clue Cells Wet Prep HPF POC MODERATE (*)    WBC, Wet Prep HPF POC FEW (*)    All other components within normal limits  URINALYSIS, ROUTINE W REFLEX MICROSCOPIC - Abnormal; Notable for the following:    APPearance CLOUDY (*)    Hgb urine dipstick MODERATE (*)    Protein, ur 100 (*)    Nitrite POSITIVE (*)    Leukocytes, UA LARGE (*)    All other components within normal limits  GC/CHLAMYDIA PROBE AMP  URINE CULTURE  PREGNANCY, URINE  URINE MICROSCOPIC-ADD ON  RPR  HIV ANTIBODY (ROUTINE TESTING)    Imaging Review Ct Renal Stone Study  06/12/2014   CLINICAL DATA:  Severe left lower rib pain and left groin pain. Difficulty urinating. Question of hematuria.  EXAM: CT RENAL STONE PROTOCOL  TECHNIQUE: Multidetector CT imaging of the abdomen and pelvis was performed following the standard protocol without intravenous contrast  COMPARISON:  MRI of the lumbar spine performed 11/13/2008  FINDINGS: The visualized lung bases are clear.  The liver and spleen are unremarkable in appearance. The gallbladder is within normal limits. The pancreas and adrenal glands are unremarkable.  A 4.0 cm cyst is noted at the interpole region of the left kidney, with minimal peripheral calcification. The kidneys are otherwise unremarkable. There is no evidence of hydronephrosis. No renal or ureteral stones are seen. No perinephric stranding is appreciated.  No free fluid is identified. The small bowel is unremarkable in appearance. The stomach is within normal limits. No acute vascular abnormalities are seen.  The appendix is normal in caliber,  without evidence for appendicitis. Minimal diverticulosis is noted along the descending colon. The colon is otherwise unremarkable in appearance.  The bladder is decompressed and grossly unremarkable in appearance. The uterus is grossly unremarkable. No suspicious adnexal masses are seen No inguinal lymphadenopathy is seen.  No acute osseous abnormalities are identified.  IMPRESSION: 1. No renal or ureteral stone seen.  No evidence of hydronephrosis. 2. 4.0 cm left renal cyst, with minimal peripheral calcification. Kidneys otherwise unremarkable in appearance. 3. Minimal diverticulosis along the descending colon, without evidence of diverticulitis.   Electronically Signed   By: Roanna Raider M.D.   On: 06/12/2014 06:20     EKG Interpretation None     Meds given in ED:  Medications  ketorolac (TORADOL) 30 MG/ML injection 30 mg (30 mg Intravenous Given 06/12/14 0417)    Discharge Medication List as of 06/12/2014  6:58 AM    START taking these medications   Details  ciprofloxacin (CIPRO) 500 MG tablet Take 1 tablet (500 mg total) by mouth  2 (two) times daily., Starting 06/12/2014, Until Discontinued, Print    metroNIDAZOLE (FLAGYL) 500 MG tablet Take 1 tablet (500 mg total) by mouth 2 (two) times daily., Starting 06/12/2014, Until Discontinued, Print          MDM   Final diagnoses:  UTI (lower urinary tract infection)  Trichomonal infection   Patient presents with urinary symptoms and significant left flank pain. Patient is afebrile, non toxic but in mild distress. She cannot get comfortable.  UA shows large leukocytes and positive nitrites. Patient has a urinary tract infection. Patient is afebrile but with significant CVA tenderness. The source of her flank pain is uncertain and may be due to pyelonephritis or kidney cyst. Will treat for pyelonephritis to be conservative. Urine culture ordered. Patient with anaphylactic reaction to penicillins and sulfa drugs. Will treat with Cipro. CT of  abdominopelvic showed no nephrolithiasis, hydronephrosis or kidney abscess.   Wet prep shows moderate clue cells and she had mild fishy odor. She has few WBC. She has had vaginitis before. Will treat with flagyl. STD panel pending.  Follow up with PCP in 2-3 days.  Discussed return precautions with patient. Discussed all results and patient verbalizes understanding and agrees with plan.  This is a shared patient. This patient was discussed with the physician who saw and evaluated the patient.     Louann SjogrenVictoria L Bryce Kimble, PA-C 06/12/14 [phone]

## 2014-06-12 NOTE — ED Notes (Signed)
Painful urination began two days ago unrelieved by drinking cranberry juice.  Tonight pain left flank woke her from sleep.

## 2014-06-12 NOTE — ED Provider Notes (Signed)
Medical screening examination/treatment/procedure(s) were conducted as a shared visit with non-physician practitioner(s) and myself.  I personally evaluated the patient during the encounter  Please see my separate respective documentation pertaining to this patient encounter   Vida RollerBrian D Uriel Horkey, MD 06/12/14 [phone]

## 2014-06-13 LAB — GC/CHLAMYDIA PROBE AMP
CT Probe RNA: NEGATIVE
GC PROBE AMP APTIMA: NEGATIVE

## 2014-06-15 LAB — URINE CULTURE

## 2014-06-16 ENCOUNTER — Telehealth (HOSPITAL_BASED_OUTPATIENT_CLINIC_OR_DEPARTMENT_OTHER): Payer: Self-pay | Admitting: Emergency Medicine

## 2014-06-16 NOTE — Telephone Encounter (Signed)
Post ED Visit - Positive Culture Follow-up  Culture report reviewed by antimicrobial stewardship pharmacist: []  Wes Dulaney, Pharm.D., BCPS []  Celedonio MiyamotoJeremy Frens, Pharm.D., BCPS []  Georgina PillionElizabeth Martin, [address]Pharm[address]D., BCPS []  GalenaMinh Pham, VermontPharm.D., BCPS, AAHIVP []  Estella HuskMichelle Turner, Pharm.D., BCPS, AAHIVP []  Red ChristiansSamson Lee, Pharm.D. [x]  Tennis Mustassie Stewart, Pharm.D.  Positive urine >100,000 colonies/ml E. Coli culture Treated with Ciprofloxacin hcl 500 mg po tabs; take one tablet by mouth bid x 7 days, organism sensitive to the same and no further patient follow-up is required at this time.  Berle MullMiller, Cailah Reach 06/16/2014, 10:28 AM

## 2014-08-10 ENCOUNTER — Encounter (HOSPITAL_COMMUNITY): Payer: Self-pay | Admitting: Emergency Medicine

## 2014-08-10 ENCOUNTER — Emergency Department (HOSPITAL_COMMUNITY)
Admission: EM | Admit: 2014-08-10 | Discharge: 2014-08-11 | Disposition: A | Discharge status: Home / Self Care | Payer: BC Managed Care – PPO | Attending: Emergency Medicine | Admitting: Emergency Medicine

## 2014-08-10 ENCOUNTER — Ambulatory Visit (HOSPITAL_COMMUNITY)
Admission: RE | Admit: 2014-08-10 | Discharge: 2014-08-10 | Disposition: A | Discharge status: Home / Self Care | Payer: BC Managed Care – PPO | Attending: Psychiatry | Admitting: Psychiatry

## 2014-08-10 ENCOUNTER — Ambulatory Visit (HOSPITAL_COMMUNITY): Payer: Self-pay | Admitting: Behavioral Health

## 2014-08-10 DIAGNOSIS — R51 Headache: Secondary | ICD-10-CM | POA: Diagnosis not present

## 2014-08-10 DIAGNOSIS — J45909 Unspecified asthma, uncomplicated: Secondary | ICD-10-CM | POA: Diagnosis not present

## 2014-08-10 DIAGNOSIS — Z880 Allergy status to penicillin: Secondary | ICD-10-CM | POA: Diagnosis not present

## 2014-08-10 DIAGNOSIS — Z8669 Personal history of other diseases of the nervous system and sense organs: Secondary | ICD-10-CM | POA: Insufficient documentation

## 2014-08-10 DIAGNOSIS — R45851 Suicidal ideations: Secondary | ICD-10-CM | POA: Insufficient documentation

## 2014-08-10 DIAGNOSIS — F4321 Adjustment disorder with depressed mood: Secondary | ICD-10-CM

## 2014-08-10 DIAGNOSIS — R200 Anesthesia of skin: Secondary | ICD-10-CM | POA: Diagnosis not present

## 2014-08-10 DIAGNOSIS — Z3202 Encounter for pregnancy test, result negative: Secondary | ICD-10-CM | POA: Insufficient documentation

## 2014-08-10 DIAGNOSIS — Z79899 Other long term (current) drug therapy: Secondary | ICD-10-CM | POA: Diagnosis not present

## 2014-08-10 DIAGNOSIS — Z862 Personal history of diseases of the blood and blood-forming organs and certain disorders involving the immune mechanism: Secondary | ICD-10-CM | POA: Insufficient documentation

## 2014-08-10 DIAGNOSIS — F329 Major depressive disorder, single episode, unspecified: Secondary | ICD-10-CM | POA: Diagnosis present

## 2014-08-10 LAB — COMPREHENSIVE METABOLIC PANEL
ALT: 19 U/L (ref 0–35)
AST: 17 U/L (ref 0–37)
Albumin: 4.4 g/dL (ref 3.5–5.2)
Alkaline Phosphatase: 50 U/L (ref 39–117)
Anion gap: 17 — ABNORMAL HIGH (ref 5–15)
BUN: 8 mg/dL (ref 6–23)
CALCIUM: 9.6 mg/dL (ref 8.4–10.5)
CO2: 22 meq/L (ref 19–32)
Chloride: 101 mEq/L (ref 96–112)
Creatinine, Ser: 0.64 mg/dL (ref 0.50–1.10)
GFR calc Af Amer: >90 mL/min (ref >90)
Glucose, Bld: 92 mg/dL (ref 70–99)
Potassium: 3.4 mEq/L — ABNORMAL LOW (ref 3.7–5.3)
SODIUM: 140 meq/L (ref 137–147)
TOTAL PROTEIN: 7.7 g/dL (ref 6.0–8.3)
Total Bilirubin: 0.3 mg/dL (ref 0.3–1.2)

## 2014-08-10 LAB — RAPID URINE DRUG SCREEN, HOSP PERFORMED
AMPHETAMINES: NOT DETECTED
Barbiturates: NOT DETECTED
Benzodiazepines: NOT DETECTED
COCAINE: NOT DETECTED
OPIATES: NOT DETECTED
Tetrahydrocannabinol: POSITIVE — AB

## 2014-08-10 LAB — ETHANOL

## 2014-08-10 LAB — CBC
HCT: 40.1 % (ref 36.0–46.0)
Hemoglobin: 14.4 g/dL (ref 12.0–15.0)
MCH: 30.4 pg (ref 26.0–34.0)
MCHC: 35.9 g/dL (ref 30.0–36.0)
MCV: 84.6 fL (ref 78.0–100.0)
PLATELETS: 246 10*3/uL (ref 150–400)
RBC: 4.74 MIL/uL (ref 3.87–5.11)
RDW: 13.2 % (ref 11.5–15.5)
WBC: 9 10*3/uL (ref 4.0–10.5)

## 2014-08-10 LAB — POC URINE PREG, ED: Preg Test, Ur: NEGATIVE

## 2014-08-10 MED ORDER — ACETAMINOPHEN 325 MG PO TABS: 650.0000 mg | ORAL_TABLET | ORAL | Status: DC | PRN

## 2014-08-10 MED ORDER — IBUPROFEN 200 MG PO TABS: 600.0000 mg | ORAL_TABLET | Freq: Three times a day (TID) | ORAL | Status: DC | PRN

## 2014-08-10 MED ORDER — ZOLPIDEM TARTRATE 5 MG PO TABS
5.0000 mg | ORAL_TABLET | Freq: Every evening | ORAL | Status: DC | PRN
  Administered 2014-08-10: 5 mg via ORAL
  Filled 2014-08-10: qty 1

## 2014-08-10 MED ORDER — NICOTINE 21 MG/24HR TD PT24
21.0000 mg | MEDICATED_PATCH | Freq: Every day | TRANSDERMAL | Status: DC
  Administered 2014-08-11: 21 mg via TRANSDERMAL
  Filled 2014-08-10: qty 1

## 2014-08-10 MED ORDER — ALUM & MAG HYDROXIDE-SIMETH 200-200-20 MG/5ML PO SUSP: 30.0000 mL | ORAL | Status: DC | PRN

## 2014-08-10 MED ORDER — LORAZEPAM 1 MG PO TABS
1.0000 mg | ORAL_TABLET | Freq: Three times a day (TID) | ORAL | Status: DC | PRN
  Administered 2014-08-10: 1 mg via ORAL
  Filled 2014-08-10: qty 1

## 2014-08-10 MED ORDER — ONDANSETRON HCL 4 MG PO TABS: 4.0000 mg | ORAL_TABLET | Freq: Three times a day (TID) | ORAL | Status: DC | PRN

## 2014-08-10 NOTE — ED Provider Notes (Signed)
CSN: [id number]     Arrival date & time 08/10/14  1443 History  This chart was scribed for non-physician practitioner working with Dr. Gwyneth Sprout by Elveria Rising, ED Scribe. This patient was seen in room [facility] and the patient's care was started at 3:50 PM.   Chief Complaint  Patient presents with  . Depression   The history is provided by the patient. No language interpreter was used.   HPI Comments: Patricia Arnold is a 38 y.o. female with PMHx of Bipolar depression who presents to the Emergency Department complaining of increased depression attributed to recent stress. Patient reports suicidal ideation this morning at work. Patient reports that she feels very overwhelmed. Patient does not take any medications for the anxiety or depression. Patient shares that she was at [facility] earlier today; she was sent here for observation.  Patient reports psychiatric treatment in [place] last year.    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 Patient reports corrective surgery, but reports continued pain in her hands with numbness and tingling pain in her left fourth and fifth fingers. Patient also reports bilateral pain in her knees. She reports audible popping and cracking with associated pain when standing.  Patient shares history of migraines. Currently she is experiencing typical, baseline head pain.  Patient denies history of drug or alcohol use.  Past Medical History  Diagnosis Date  . Anemia   . Headache(784.0)   . Uveitis   . Asthma   . Bipolar depression    Past Surgical History  Procedure Laterality Date  . Reconstrictive    . Elbow surgery     Family History  Problem Relation Age of Onset  . Other Neg Hx    History  Substance Use Topics  . Smoking status: Never Smoker   . Smokeless tobacco: Not on file  . Alcohol Use: No   OB History   Grav Para Term Preterm Abortions TAB SAB Ect  Mult Living   3 1 1  0 1 0 1 0 0 1     Review of Systems  Constitutional: Negative for fever and chills.  Musculoskeletal: Positive for arthralgias. Negative for joint swelling.  Neurological: Positive for numbness and headaches.  Psychiatric/Behavioral: Positive for suicidal ideas.   Allergies  Namenda; Penicillins; Sulfa antibiotics; Tizanidine; and Tramadol  Home Medications   Prior to Admission medications   Medication Sig Start Date End Date Taking? Authorizing Provider  ranitidine (ZANTAC) 150 MG tablet Take 150 mg by mouth 2 (two) times daily.   Yes Historical Provider, MD  medroxyPROGESTERone (DEPO-PROVERA) 150 MG/ML injection Inject 150 mg into the muscle every 3 (three) months.    Historical Provider, MD   Triage Vitals: BP 111/66  Pulse 82  Temp(Src) 98.4 F (36.9 C) (Oral)  Resp 16  SpO2 100%  Physical Exam  Nursing note and vitals reviewed. Constitutional: She is oriented to person, place, and time. She appears well-developed and well-nourished. No distress.  HENT:  Head: Normocephalic and atraumatic.  Eyes: EOM are normal.  Neck: Neck supple.  Cardiovascular: Normal rate.   Pulmonary/Chest: Effort normal. No respiratory distress.  Musculoskeletal: Normal range of motion.  FROM all extremities. 5/5 strength in upper and lower extremities bilaterally.   Neurological: She is alert and oriented to person, place, and time.  Skin: Skin is warm and dry.  Psychiatric: Her speech is normal and behavior is normal. She exhibits a depressed mood. She expresses suicidal ideation.  She expresses no suicidal plans.    ED Course  Procedures (including critical care time)  COORDINATION OF CARE: 3:58 PM- Discussed treatment plan with patient at bedside and patient agreed to plan.   Labs Review Labs Reviewed  COMPREHENSIVE METABOLIC PANEL - Abnormal; Notable for the following:    Potassium 3.4 (*)    Anion gap 17 (*)    All other components within normal limits  URINE  RAPID DRUG SCREEN (HOSP PERFORMED) - Abnormal; Notable for the following:    Tetrahydrocannabinol POSITIVE (*)    All other components within normal limits  CBC  ETHANOL  POC URINE PREG, ED    Imaging Review No results found.   EKG Interpretation None      MDM   Final diagnoses:  Suicidal ideation    Pt is a 38yo female [facility] SI.  Pt c/o chronic arthritis type pain in elbows, hands, and knees. No other complaints. Pt is medically cleared to be evaluated by TTS to help determine pt's disposition.   I personally performed the services described in this documentation, which was scribed in my presence. The recorded information has been reviewed and is accurate.    Junius Finnerrin O'Malley, PA-C 08/10/14 1734

## 2014-08-10 NOTE — [facility] Counselor (Signed)
The counselor ran the Pt by NP Renata Capriceonrad. Per Renata Capriceonrad the Pt meets inpatient criteria. AC reports no beds. [facility] and [facility].  Wolfgang PhoenixBrandi Vipul Cafarelli, [facility] Triage Specialist

## 2014-08-10 NOTE — ED Notes (Signed)
Per pt, states she has been under a lot of stress-felt suicidal at work this am-No SI at this time

## 2014-08-10 NOTE — [facility] Counselor (Signed)
Counselor completed walk-in.  Wolfgang PhoenixBrandi Jaclynne Baldo, [facility] Triage Specialist

## 2014-08-10 NOTE — ED Notes (Signed)
Patient presents sad and tearful states that she is going through a lot of stressors since 2013; when she lost custody of her son who is currently in school 5 minutes away and still she has no contact with him; states that her father has terminal lung cancer and that she just recently lost her best friend and sounding board of 24 years due to a disagreement. States that her body is in constant pain due to past surgeries and that she has stopped taking her pain medications because she doesn't like feeling loopy and confused all day and she is fearful that if there is any other problems going on with her body that it would be masked by the pain medications. She denies any current SI but endorses severe hopelessness and just feels that she needs some rest; states that she works from home and also goes to school on line; she sleeps approx. 3 hours a night; denies HI/AVH. NAD

## 2014-08-10 NOTE — ED Notes (Signed)
Up to the phone 
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
## 2014-08-11 ENCOUNTER — Encounter (HOSPITAL_COMMUNITY): Payer: Self-pay | Admitting: Psychiatry

## 2014-08-11 DIAGNOSIS — F4321 Adjustment disorder with depressed mood: Secondary | ICD-10-CM

## 2014-08-11 NOTE — Consult Note (Signed)
[facility] Face-to-Face Psychiatry Consult   Reason for Consult:  Depression Referring Physician:  EDP  Patricia Arnold is an 38 y.o. female. Total Time spent with patient: 20 minutes  Assessment: AXIS I:  Adjustment Disorder with Depressed Mood AXIS II:  Deferred AXIS III:   Past Medical History  Diagnosis Date  . Anemia   . Headache(784.0)   . Uveitis   . Asthma   . Bipolar depression    AXIS IV:  other psychosocial or environmental problems, problems related to social environment and problems with primary support group AXIS V:  61-70 mild symptoms  Plan:  No evidence of imminent risk to self or others at present.  Dr. Darleene Cleaver assessed the patient and concurs with the plan.  Subjective:   Patricia Arnold is a 38 y.o. female patient does not warrant admission.  HPI:  Patient was overwhelmed with stress of her 64 yo son and her father's terminal state and came to the ED.  Today, she denies suicidal/homicidal ideations, hallucinations, and alcohol/drug use.  Patricia Arnold states she is better because "I have been away from everyone; uninterrupted time with no emails, texts, or instant messaging."  She states she need to be by herself and feel much better.  No past suicide attempts.  One admission at [facility] last year for depression and was started on Celexa but taper off.  She is interested in outpatient therapy at this time. HPI Elements:   Location:  generalized. Quality:  acute. Severity:  mild. Timing:  intermittent. Duration:  brief. Context:  stressors.  Past Psychiatric History: Past Medical History  Diagnosis Date  . Anemia   . Headache(784.0)   . Uveitis   . Asthma   . Bipolar depression     reports that she has never smoked. She does not have any smokeless tobacco history on file. She reports that she does not drink alcohol or use illicit drugs. Family History  Problem Relation Age of Onset  . Other Neg Hx            Allergies:   Allergies  Allergen  Reactions  . Namenda [Memantine Hcl] Other (See Comments)    Loss of vision, causes very bad bleeding/period  . Penicillins Anaphylaxis  . Sulfa Antibiotics Anaphylaxis  . Tizanidine Anaphylaxis  . Tramadol Anaphylaxis    ACT Assessment Complete:  Yes:    Educational Status    Risk to Self: Risk to self with the past 6 months Is patient at risk for suicide?: No, but patient needs Medical Clearance Substance abuse history and/or treatment for substance abuse?: No  Risk to Others:    Abuse:    Prior Inpatient Therapy:    Prior Outpatient Therapy:    Additional Information:                    Objective: Blood pressure 123/77, pulse 68, temperature 98.9 F (37.2 C), temperature source Oral, resp. rate 16, SpO2 98.00%.There is no weight on file to calculate BMI. Results for orders placed during the hospital encounter of 08/10/14 (from the past 72 hour(s))  CBC     Status: None   Collection Time    08/10/14  3:19 PM      Result Value Ref Range   WBC 9.0  4.0 - 10.5 K/uL   RBC 4.74  3.87 - 5.11 MIL/uL   Hemoglobin 14.4  12.0 - 15.0 g/dL   HCT 40.1  36.0 - 46.0 %   MCV 84.6  78.0 - 100.0 fL   MCH 30.4  26.0 - 34.0 pg   MCHC 35.9  30.0 - 36.0 g/dL   RDW 13.2  11.5 - 15.5 %   Platelets 246  150 - 400 K/uL  COMPREHENSIVE METABOLIC PANEL     Status: Abnormal   Collection Time    08/10/14  3:19 PM      Result Value Ref Range   Sodium 140  137 - 147 mEq/L   Potassium 3.4 (*) 3.7 - 5.3 mEq/L   Chloride 101  96 - 112 mEq/L   CO2 22  19 - 32 mEq/L   Glucose, Bld 92  70 - 99 mg/dL   BUN 8  6 - 23 mg/dL   Creatinine, Ser 0.64  0.50 - 1.10 mg/dL   Calcium 9.6  8.4 - 10.5 mg/dL   Total Protein 7.7  6.0 - 8.3 g/dL   Albumin 4.4  3.5 - 5.2 g/dL   AST 17  0 - 37 U/L   ALT 19  0 - 35 U/L   Alkaline Phosphatase 50  39 - 117 U/L   Total Bilirubin 0.3  0.3 - 1.2 mg/dL   GFR calc non Af Amer >90  >90 mL/min   GFR calc Af Amer >90  >90 mL/min   Comment: (NOTE)     The eGFR  has been calculated using the CKD EPI equation.     This calculation has not been validated in all clinical situations.     eGFR's persistently <90 mL/min signify possible Chronic Kidney     Disease.   Anion gap 17 (*) 5 - 15  ETHANOL     Status: None   Collection Time    08/10/14  3:19 PM      Result Value Ref Range   Alcohol, Ethyl (B) <11  0 - 11 mg/dL   Comment:            LOWEST DETECTABLE LIMIT FOR     SERUM ALCOHOL IS 11 mg/dL     FOR MEDICAL PURPOSES ONLY  URINE RAPID DRUG SCREEN (HOSP PERFORMED)     Status: Abnormal   Collection Time    08/10/14  3:23 PM      Result Value Ref Range   Opiates NONE DETECTED  NONE DETECTED   Cocaine NONE DETECTED  NONE DETECTED   Benzodiazepines NONE DETECTED  NONE DETECTED   Amphetamines NONE DETECTED  NONE DETECTED   Tetrahydrocannabinol POSITIVE (*) NONE DETECTED   Barbiturates NONE DETECTED  NONE DETECTED   Comment:            DRUG SCREEN FOR MEDICAL PURPOSES     ONLY.  IF CONFIRMATION IS NEEDED     FOR ANY PURPOSE, NOTIFY LAB     WITHIN 5 DAYS.                LOWEST DETECTABLE LIMITS     FOR URINE DRUG SCREEN     Drug Class       Cutoff (ng/mL)     Amphetamine      1000     Barbiturate      200     Benzodiazepine   253     Tricyclics       664     Opiates          300     Cocaine          300     THC  62  POC URINE PREG, ED     Status: None   Collection Time    08/10/14  3:35 PM      Result Value Ref Range   Preg Test, Ur NEGATIVE  NEGATIVE   Comment:            THE SENSITIVITY OF THIS     METHODOLOGY IS >24 mIU/mL   Labs are reviewed and are pertinent for no medical issues noted.  Current Facility-Administered Medications  Medication Dose Route Frequency Provider Last Rate Last Dose  . acetaminophen (TYLENOL) tablet 650 mg  650 mg Oral Q4H PRN Noland Fordyce, PA-C      . alum & mag hydroxide-simeth (MAALOX/MYLANTA) 200-200-20 MG/5ML suspension 30 mL  30 mL Oral PRN Noland Fordyce, PA-C      . ibuprofen  (ADVIL,MOTRIN) tablet 600 mg  600 mg Oral Q8H PRN Noland Fordyce, PA-C      . LORazepam (ATIVAN) tablet 1 mg  1 mg Oral Q8H PRN Noland Fordyce, PA-C   1 mg at 08/10/14 2142  . nicotine (NICODERM CQ - dosed in mg/24 hours) patch 21 mg  21 mg Transdermal Daily Noland Fordyce, PA-C   21 mg at 08/11/14 1012  . ondansetron (ZOFRAN) tablet 4 mg  4 mg Oral Q8H PRN Noland Fordyce, PA-C      . zolpidem (AMBIEN) tablet 5 mg  5 mg Oral QHS PRN Noland Fordyce, PA-C   5 mg at 08/10/14 2142   Current Outpatient Prescriptions  Medication Sig Dispense Refill  . ranitidine (ZANTAC) 150 MG tablet Take 150 mg by mouth 2 (two) times daily.      . medroxyPROGESTERone (DEPO-PROVERA) 150 MG/ML injection Inject 150 mg into the muscle every 3 (three) months.      . [DISCONTINUED] traZODone (DESYREL) 50 MG tablet Take 50 mg by mouth at bedtime.        Psychiatric Specialty Exam:     Blood pressure 123/77, pulse 68, temperature 98.9 F (37.2 C), temperature source Oral, resp. rate 16, SpO2 98.00%.There is no weight on file to calculate BMI.  General Appearance: Casual  Eye Contact::  Good  Speech:  Normal Rate  Volume:  Normal  Mood:  Depressed  Affect:  Congruent  Thought Process:  Coherent  Orientation:  Full (Time, Place, and Person)  Thought Content:  WDL  Suicidal Thoughts:  No  Homicidal Thoughts:  No  Memory:  Immediate;   Good Recent;   Good Remote;   Good  Judgement:  Fair  Insight:  Good  Psychomotor Activity:  Normal  Concentration:  Good  Recall:  Good  Fund of Knowledge:Good  Language: Good  Akathisia:  No  Handed:  Right  AIMS (if indicated):     Assets:  Desire for Improvement Housing Physical Health Resilience Social Support  Sleep:      Musculoskeletal: Strength & Muscle Tone: within normal limits Gait & Station: normal Patient leans: N/A  Treatment Plan Summary: Discharge home with follow-up with outside providers, resources given.  Waylan Boga, [place] 08/11/2014 11:25 AM   Patient seen, evaluated and I agree with notes by Nurse Practitioner. Corena Pilgrim, MD

## 2014-08-11 NOTE — [facility] Suicide Risk Assessment (Signed)
Suicide Risk Assessment  Discharge Assessment     Demographic Factors:  Caucasian  Total Time spent with patient: 20 minutes  Psychiatric Specialty Exam:     Blood pressure 123/77, pulse 68, temperature 98.9 F (37.2 C), temperature source Oral, resp. rate 16, SpO2 98.00%.There is no weight on file to calculate BMI.  General Appearance: Casual  Eye Contact::  Good  Speech:  Normal Rate  Volume:  Normal  Mood:  Depressed  Affect:  Congruent  Thought Process:  Coherent  Orientation:  Full (Time, Place, and Person)  Thought Content:  WDL  Suicidal Thoughts:  No  Homicidal Thoughts:  No  Memory:  Immediate;   Good Recent;   Good Remote;   Good  Judgement:  Fair  Insight:  Good  Psychomotor Activity:  Normal  Concentration:  Good  Recall:  Good  Fund of Knowledge:Good  Language: Good  Akathisia:  No  Handed:  Right  AIMS (if indicated):     Assets:  Desire for Improvement Housing Physical Health Resilience Social Support  Sleep:      Musculoskeletal: Strength & Muscle Tone: within normal limits Gait & Station: normal Patient leans: N/A  Mental Status Per Nursing Assessment::   On Admission:   Depression  Current Mental Status by Physician: NA  Loss Factors: NA  Historical Factors: NA  Risk Reduction Factors:   Sense of responsibility to family, Employed, Positive social support and Positive coping skills or problem solving skills  Continued Clinical Symptoms:  Depression  Cognitive Features That Contribute To Risk:  Depression  Suicide Risk:  Minimal: No identifiable suicidal ideation.  Patients presenting with no risk factors but with morbid ruminations; may be classified as minimal risk based on the severity of the depressive symptoms  Discharge Diagnoses:   AXIS I:  Adjustment Disorder with Depressed Mood AXIS II:  Deferred AXIS III:   Past Medical History  Diagnosis Date  . Anemia   . Headache(784.0)   . Uveitis   . Asthma   .  Bipolar depression    AXIS IV:  other psychosocial or environmental problems, problems related to social environment and problems with primary support group AXIS V:  61-70 mild symptoms  Plan Of Care/Follow-up recommendations:  Activity:  as tolerated Diet:  low-sodium heart healthy diet  Is patient on multiple antipsychotic therapies at discharge:  No   Has Patient had three or more failed trials of antipsychotic monotherapy by history:  No  Recommended Plan for Multiple Antipsychotic Therapies: NA    Maximillion Gill, PMH-NP 08/11/2014, 11:29 AM

## 2014-08-11 NOTE — ED Notes (Addendum)
D: Patient is alert and oriented. Pts mood is appropriate for situation. Pt's affect is blunted. Pt currently denies SI/HI and AVH. Pt states surprisingly to her she slept pretty well last night. Pt reports desire to do outpatient care upon discharge from ED. Pt states her current stressors are the recent loss of a 24 year long friendship, loss of her relationship with her son, and her father's diagnosis of terminal lung cancer. Pt is observed resting, ambulating, and using the telephone this am.   A: RN accompanied provider during morning rounds and pt was notified that she will be discharged today. Support and encouragement provided to patient. Pt encouraged to use resources provided to her upon discharge.15 minute checks completed per protocol for patient safety.   R: Pt receptive and cooperative. Pt expresses that she believes she will be able to keep up with her outpatient activities upon discharge and denies any other concerns at this time.   Alena BillsGuthrie, Kemia Wendel A, RN

## 2014-08-11 NOTE — [facility] Assessment (Signed)
Discharge per Dr. Jannifer FranklinAkintayo and Nanine MeansJamison Lord, NP. Patient given outpatient follow up referrals. Additionally, patient given a letter for work/school explaining that she presented for a brief assessment and was discharged home to follow up with outpatient.

## 2014-08-11 NOTE — ED Provider Notes (Signed)
Medical screening examination/treatment/procedure(s) were performed by non-physician practitioner and as supervising physician I was immediately available for consultation/collaboration.   EKG Interpretation None        Ryon Layton, MD 08/11/14 1255 

## 2014-09-04 ENCOUNTER — Encounter (HOSPITAL_COMMUNITY): Payer: Self-pay | Admitting: Psychiatry

## 2015-08-16 ENCOUNTER — Emergency Department (HOSPITAL_COMMUNITY)
Admission: EM | Admit: 2015-08-16 | Discharge: 2015-08-16 | Disposition: A | Discharge status: Home / Self Care | Payer: Self-pay | Attending: Emergency Medicine | Admitting: Emergency Medicine

## 2015-08-16 ENCOUNTER — Encounter (HOSPITAL_COMMUNITY): Payer: Self-pay | Admitting: Emergency Medicine

## 2015-08-16 DIAGNOSIS — Z862 Personal history of diseases of the blood and blood-forming organs and certain disorders involving the immune mechanism: Secondary | ICD-10-CM | POA: Insufficient documentation

## 2015-08-16 DIAGNOSIS — Z79899 Other long term (current) drug therapy: Secondary | ICD-10-CM | POA: Insufficient documentation

## 2015-08-16 DIAGNOSIS — J45901 Unspecified asthma with (acute) exacerbation: Secondary | ICD-10-CM | POA: Insufficient documentation

## 2015-08-16 DIAGNOSIS — Z880 Allergy status to penicillin: Secondary | ICD-10-CM | POA: Insufficient documentation

## 2015-08-16 DIAGNOSIS — Z8669 Personal history of other diseases of the nervous system and sense organs: Secondary | ICD-10-CM | POA: Insufficient documentation

## 2015-08-16 DIAGNOSIS — Z8659 Personal history of other mental and behavioral disorders: Secondary | ICD-10-CM | POA: Insufficient documentation

## 2015-08-16 MED ORDER — PREDNISONE 20 MG PO TABS
60.0000 mg | ORAL_TABLET | Freq: Once | ORAL | Status: AC
  Administered 2015-08-16: 60 mg via ORAL
  Filled 2015-08-16: qty 3

## 2015-08-16 MED ORDER — IPRATROPIUM-ALBUTEROL 0.5-2.5 (3) MG/3ML IN SOLN
RESPIRATORY_TRACT | Status: AC
  Filled 2015-08-16: qty 3

## 2015-08-16 MED ORDER — PREDNISONE 20 MG PO TABS
60.0000 mg | ORAL_TABLET | Freq: Every day | ORAL | Status: DC
Start: 2015-08-16 — End: 2016-04-14

## 2015-08-16 MED ORDER — IPRATROPIUM-ALBUTEROL 0.5-2.5 (3) MG/3ML IN SOLN
3.0000 mL | Freq: Once | RESPIRATORY_TRACT | Status: AC
  Administered 2015-08-16: 3 mL via RESPIRATORY_TRACT

## 2015-08-16 MED ORDER — ALBUTEROL SULFATE HFA 108 (90 BASE) MCG/ACT IN AERS
2.0000 | INHALATION_SPRAY | Freq: Once | RESPIRATORY_TRACT | Status: AC
  Administered 2015-08-16: 2 via RESPIRATORY_TRACT
  Filled 2015-08-16: qty 6.7

## 2015-08-16 NOTE — ED Notes (Signed)
Pt reports she is having asthma attack that started at 3pm today. She has been taking her albuterol inhaler without relief. And her peak flows have decreased. Lung sounds clear upon arrival but pt c.o chest tightness. Duo-neb administered in triage.

## 2015-08-16 NOTE — ED Notes (Signed)
Dr. Oni at bedside. 

## 2015-08-16 NOTE — ED Notes (Signed)
Patient received albuterol inhaler to take home, patient familiar with use.  Reviewed discharge instructions and prescription medication with patient, who verbalized understanding.

## 2015-08-16 NOTE — ED Provider Notes (Addendum)
CSN: [id number]     Arrival date & time 08/16/15  2119 History   By signing my name below, I, Patricia Arnold, attest that this documentation has been prepared under the direction and in the presence of Patricia Crumble, MD. Electronically Signed: Arlan Arnold, ED Scribe. 08/16/2015. 11:26 PM.   Chief Complaint  Patient presents with  . Asthma   The history is provided by the patient. No language interpreter was used.     HPI Comments: Patricia Arnold is a 39 y.o. female with a PMHx of asthma who presents to the Emergency Department here after an asthma flare this evening. Pt states at approximately 3:00 PM this afternoon while outside and cutting the grass she began to experience constant coughing and wheezing. At home Albuterol attempted with mild temporary improvement. Denies any shortness of breath or chest tightness. No recent fever, chills, abdominal pain, nausea, or vomiting. Patricia Arnold states her asthma is typically triggered by stress. Last exacerbation several months ago.  Past Medical History  Diagnosis Date  . Anemia   . Headache(784.0)   . Uveitis   . Asthma   . Bipolar depression [facility])    Past Surgical History  Procedure Laterality Date  . Reconstrictive    . Elbow surgery     Family History  Problem Relation Age of Onset  . Other Neg Hx    Social History  Substance Use Topics  . Smoking status: Never Smoker   . Smokeless tobacco: None  . Alcohol Use: No   OB History    Gravida Para Term Preterm AB TAB SAB Ectopic Multiple Living   0 1 0 1 0 0 1     Review of Systems  Constitutional: Negative for fever and chills.  Respiratory: Positive for cough and wheezing. Negative for shortness of breath.   Cardiovascular: Negative for chest pain.  Gastrointestinal: Negative for nausea, vomiting and abdominal pain.  Genitourinary: Negative for dysuria.  Musculoskeletal: Negative for back pain.  Skin: Negative for rash.  Neurological: Negative for headaches.   Psychiatric/Behavioral: Negative for confusion.  All other systems reviewed and are negative.     Allergies  Namenda; Penicillins; Sulfa antibiotics; Tizanidine; and Tramadol  Home Medications   Prior to Admission medications   Medication Sig Start Date End Date Taking? Authorizing Provider  medroxyPROGESTERone (DEPO-PROVERA) 150 MG/ML injection Inject 150 mg into the muscle every 3 (three) months.    Historical Provider, MD  ranitidine (ZANTAC) 150 MG tablet Take 150 mg by mouth 2 (two) times daily.    Historical Provider, MD   Triage Vitals: BP 119/86 mmHg  Pulse 96  Temp(Src) 98.2 F (36.8 C) (Oral)  Resp 20  SpO2 100%  LMP 08/06/2015   Physical Exam  Constitutional: She is oriented to person, place, and time. She appears well-developed and well-nourished. No distress.  HENT:  Head: Normocephalic and atraumatic.  Nose: Nose normal.  Mouth/Throat: Oropharynx is clear and moist. No oropharyngeal exudate.  Eyes: Conjunctivae and EOM are normal. Pupils are equal, round, and reactive to light. No scleral icterus.  Neck: Normal range of motion. Neck supple. No JVD present. No tracheal deviation present. No thyromegaly present.  Cardiovascular: Normal rate, regular rhythm and normal heart sounds.  Exam reveals no gallop and no friction rub.   No murmur heard. Pulmonary/Chest: Effort normal and breath sounds normal. No respiratory distress. She has no wheezes. She exhibits no tenderness.  Abdominal: Soft. Bowel sounds are normal. She exhibits no distension and  no mass. There is no tenderness. There is no rebound and no guarding.  Musculoskeletal: Normal range of motion. She exhibits no edema or tenderness.  Lymphadenopathy:    She has no cervical adenopathy.  Neurological: She is alert and oriented to person, place, and time. No cranial nerve deficit. She exhibits normal muscle tone.  Skin: Skin is warm and dry. No rash noted. No erythema. No pallor.  Nursing note and vitals  reviewed.   ED Course  Procedures (including critical care time)  DIAGNOSTIC STUDIES: Oxygen Saturation is 100% on RA, Normal by my interpretation.    COORDINATION OF CARE: 11:11 PM- Will give breathing treatment. Will order EKG. Discussed treatment plan with pt at bedside and pt agreed to plan.     Labs Review Labs Reviewed - No data to display  Imaging Review No results found. I have personally reviewed and evaluated these images and lab results as part of my medical decision-making.   EKG Interpretation   Date/Time:  Thursday August 16 2015 21:31:39 EDT Ventricular Rate:  90 PR Interval:  130 QRS Duration: 78 QT Interval:  366 QTC Calculation: 447 R Axis:   87 Text Interpretation:  Normal sinus rhythm Biatrial enlargement Abnormal  ECG No significant change since last tracing Artifact Confirmed by Patricia Arnold,  Patricia Arnold [phone]([id number]) on 08/16/2015 11:29:22 PM      MDM   Final diagnoses:  None   Patient presents emergency department for asthma exacerbation. She states this is only brought on by stress. She was given a DuoNeb treatment prior to my evaluation, physical exam does not reveal any tachypnea or wheezing. She states she feels back to her baseline. Will give patient dose of prednisone and discharged with 5 day course. I'll be gone inhaler was refilled. Patient appears well and in no acute distress, vital signs were within her normal limits and she is safe for discharge.   I, Patricia Arnold, personally performed the services described in this documentation. All medical record entries made by the scribe were at my direction and in my presence.  I have reviewed the chart and discharge instructions and agree that the record reflects my personal performance and is accurate and complete. Patricia Arnold.  08/16/2015. 11:27 PM.        Patricia CrumbleAdeleke Amela Handley, MD 08/16/15 2346

## 2015-08-16 NOTE — Discharge Instructions (Signed)
Asthma Attack Prevention Ms. Buendia, use your albuterol inhaler every 6 hours for the next 2 days. Take prednisone for a 5 day course. See primary care physician within 3 days for close follow-up. If symptoms worsen come back to the emergency department immediately. Thank you. While you may not be able to control the fact that you have asthma, you can take actions to prevent asthma attacks. The best way to prevent asthma attacks is to maintain good control of your asthma. You can achieve this by:  Taking your medicines as directed.  Avoiding things that can irritate your airways or make your asthma symptoms worse (asthma triggers).  Keeping track of how well your asthma is controlled and of any changes in your symptoms.  Responding quickly to worsening asthma symptoms (asthma attack).  Seeking emergency care when it is needed. WHAT ARE SOME WAYS TO PREVENT AN ASTHMA ATTACK? Have a Plan Work with your health care provider to create a written plan for managing and treating your asthma attacks (asthma action plan). This plan includes:  A list of your asthma triggers and how you can avoid them.  Information on when medicines should be taken and when their dosages should be changed.  The use of a device that measures how well your lungs are working (peak flow meter). Monitor Your Asthma Use your peak flow meter and record your results in a journal every day. A drop in your peak flow numbers on one or more days may indicate the start of an asthma attack. This can happen even before you start to feel symptoms. You can prevent an asthma attack from getting worse by following the steps in your asthma action plan. Avoid Asthma Triggers Work with your asthma health care provider to find out what your asthma triggers are. This can be done by:  Allergy testing.  Keeping a journal that notes when asthma attacks occur and the factors that may have contributed to them.  Determining if there are  other medical conditions that are making your asthma worse. Once you have determined your asthma triggers, take steps to avoid them. This may include avoiding excessive or prolonged exposure to:  Dust. Have someone dust and vacuum your home for you once or twice a week. Using a high-efficiency particulate arrestance (HEPA) vacuum is best.  Smoke. This includes campfire smoke, forest fire smoke, and secondhand smoke from tobacco products.  Pet dander. Avoid contact with animals that you know you are allergic to.  Allergens from trees, grasses or pollens. Avoid spending a lot of time outdoors when pollen counts are high, and on very windy days.  Very cold, dry, or humid air.  Mold.  Foods that contain high amounts of sulfites.  Strong odors.  Outdoor air pollutants, such as Museum/gallery exhibitions officer.  Indoor air pollutants, such as aerosol sprays and fumes from household cleaners.  Household pests, including dust mites and cockroaches, and pest droppings.  Certain medicines, including NSAIDs. Always talk to your health care provider before stopping or starting any new medicines. Medicines Take over-the-counter and prescription medicines only as told by your health care provider. Many asthma attacks can be prevented by carefully following your medicine schedule. Taking your medicines correctly is especially important when you cannot avoid certain asthma triggers. Act Quickly If an asthma attack does happen, acting quickly can decrease how severe it is and how long it lasts. Take these steps:   Pay attention to your symptoms. If you are coughing, wheezing, or having difficulty breathing,  do not wait to see if your symptoms go away on their own. Follow your asthma action plan.  If you have followed your asthma action plan and your symptoms are not improving, call your health care provider or seek immediate medical care at the nearest hospital. It is important to note how often you need to use your  fast-acting rescue inhaler. If you are using your rescue inhaler more often, it may mean that your asthma is not under control. Adjusting your asthma treatment plan may help you to prevent future asthma attacks and help you to gain better control of your condition. HOW CAN I PREVENT AN ASTHMA ATTACK WHEN I EXERCISE? Follow advice from your health care provider about whether you should use your fast-acting inhaler before exercising. Many people with asthma experience exercise-induced bronchoconstriction (EIB). This condition often worsens during vigorous exercise in cold, humid, or dry environments. Usually, people with EIB can stay very active by pre-treating with a fast-acting inhaler before exercising.   This information is not intended to replace advice given to you by your health care provider. Make sure you discuss any questions you have with your health care provider.   Document Released: 10/08/2009 Document Revised: 07/11/2015 Document Reviewed: 03/22/2015 Elsevier Interactive Patient Education [employer]2016 [employer] Inc[employer]

## 2015-08-19 ENCOUNTER — Emergency Department (HOSPITAL_COMMUNITY)
Admission: EM | Admit: 2015-08-19 | Discharge: 2015-08-19 | Disposition: A | Discharge status: Home / Self Care | Payer: BLUE CROSS/BLUE SHIELD | Attending: Emergency Medicine | Admitting: Emergency Medicine

## 2015-08-19 ENCOUNTER — Emergency Department (HOSPITAL_COMMUNITY): Payer: Self-pay

## 2015-08-19 ENCOUNTER — Encounter (HOSPITAL_COMMUNITY): Payer: Self-pay | Admitting: Emergency Medicine

## 2015-08-19 DIAGNOSIS — R079 Chest pain, unspecified: Secondary | ICD-10-CM | POA: Insufficient documentation

## 2015-08-19 DIAGNOSIS — Z793 Long term (current) use of hormonal contraceptives: Secondary | ICD-10-CM | POA: Insufficient documentation

## 2015-08-19 DIAGNOSIS — R059 Cough, unspecified: Secondary | ICD-10-CM

## 2015-08-19 DIAGNOSIS — R05 Cough: Secondary | ICD-10-CM | POA: Insufficient documentation

## 2015-08-19 DIAGNOSIS — Z880 Allergy status to penicillin: Secondary | ICD-10-CM | POA: Insufficient documentation

## 2015-08-19 DIAGNOSIS — Z8669 Personal history of other diseases of the nervous system and sense organs: Secondary | ICD-10-CM | POA: Insufficient documentation

## 2015-08-19 DIAGNOSIS — Z8659 Personal history of other mental and behavioral disorders: Secondary | ICD-10-CM | POA: Insufficient documentation

## 2015-08-19 DIAGNOSIS — J45909 Unspecified asthma, uncomplicated: Secondary | ICD-10-CM | POA: Insufficient documentation

## 2015-08-19 DIAGNOSIS — R0981 Nasal congestion: Secondary | ICD-10-CM | POA: Insufficient documentation

## 2015-08-19 DIAGNOSIS — J3489 Other specified disorders of nose and nasal sinuses: Secondary | ICD-10-CM | POA: Insufficient documentation

## 2015-08-19 DIAGNOSIS — R630 Anorexia: Secondary | ICD-10-CM | POA: Insufficient documentation

## 2015-08-19 DIAGNOSIS — Z79899 Other long term (current) drug therapy: Secondary | ICD-10-CM | POA: Insufficient documentation

## 2015-08-19 DIAGNOSIS — R509 Fever, unspecified: Secondary | ICD-10-CM | POA: Insufficient documentation

## 2015-08-19 MED ORDER — AZITHROMYCIN 250 MG PO TABS
500.0000 mg | ORAL_TABLET | Freq: Once | ORAL | Status: AC
  Administered 2015-08-19: 500 mg via ORAL
  Filled 2015-08-19: qty 2

## 2015-08-19 MED ORDER — HYDROCODONE-HOMATROPINE 5-1.5 MG/5ML PO SYRP: 5.0000 mL | ORAL_SOLUTION | Freq: Four times a day (QID) | ORAL | Status: DC | PRN

## 2015-08-19 MED ORDER — AZITHROMYCIN 250 MG PO TABS: ORAL_TABLET | ORAL | Status: DC

## 2015-08-19 NOTE — ED Provider Notes (Signed)
CSN: [id number]     Arrival date & time 08/19/15  0203 History  By signing my name below, I, Evon Slackerrance Branch, attest that this documentation has been prepared under the direction and in the presence of No att. providers found. Electronically Signed: Evon Slackerrance Branch, ED Scribe. 08/19/2015. 6:46 AM.      Chief Complaint  Patient presents with  . Cough  . Fever  . Nasal Congestion   Patient is a 39 y.o. female presenting with cough and fever. The history is provided by the patient. No language interpreter was used.  Cough Cough characteristics:  Productive Sputum characteristics:  Clear Severity:  Moderate Onset quality:  Gradual Duration:  4 days Timing:  Intermittent Context: not sick contacts   Associated symptoms: chest pain (brought on from coughing ), chills, fever and sinus congestion   Fever Associated symptoms: chest pain (brought on from coughing ), chills, congestion and cough   Associated symptoms: no nausea and no vomiting    HPI Comments: Joellyn RuedVallerie L Hafner is a 39 y.o. female who presents to the Emergency Department complaining of cough onset 4 days prior. Pt states she has associated fever, chills and congestion. Pt states that she has CP that's exacerbated when coughing. Pt also reports having decreased appetite. Pt states that a few days prior she was seen in the ED for asthma exacerbation. Pt states she has tried her asthma inhaler with no relief. Pt states that previously she has had prescription cough medicine that has provided relief. Pt denies any recent sick contacts. Pt denies nausea, vomiting, or other related symptoms.    Past Medical History  Diagnosis Date  . Anemia   . Headache(784.0)   . Uveitis   . Asthma   . Bipolar depression [facility](HCC)    Past Surgical History  Procedure Laterality Date  . Reconstrictive    . Elbow surgery     Family History  Problem Relation Age of Onset  . Other Neg Hx    Social History  Substance Use Topics  . Smoking  status: Never Smoker   . Smokeless tobacco: None  . Alcohol Use: No   OB History    Gravida Para Term Preterm AB TAB SAB Ectopic Multiple Living   3 1 1  0 1 0 1 0 0 1     Review of Systems  Constitutional: Positive for fever and chills.  HENT: Positive for congestion.   Respiratory: Positive for cough.   Cardiovascular: Positive for chest pain (brought on from coughing ).  Gastrointestinal: Negative for nausea and vomiting.  All other systems reviewed and are negative.    Allergies  Namenda; Penicillins; Sulfa antibiotics; Tizanidine; and Tramadol  Home Medications   Prior to Admission medications   Medication Sig Start Date End Date Taking? Authorizing Provider  azithromycin (ZITHROMAX) 250 MG tablet One daily for four days 08/19/15   Marily MemosJason Laylamarie Meuser, MD  HYDROcodone-homatropine [facility](HYCODAN) 5-1.5 MG/5ML syrup Take 5 mLs by mouth every 6 (six) hours as needed for cough. 08/19/15   Marily MemosJason Gale Klar, MD  medroxyPROGESTERone (DEPO-PROVERA) 150 MG/ML injection Inject 150 mg into the muscle every 3 (three) months.    Historical Provider, MD  predniSONE (DELTASONE) 20 MG tablet Take 3 tablets (60 mg total) by mouth daily. 08/16/15   Tomasita CrumbleAdeleke Oni, MD  ranitidine (ZANTAC) 150 MG tablet Take 150 mg by mouth 2 (two) times daily.    Historical Provider, MD   BP 119/82 mmHg  Pulse 65  Temp(Src) 98.8 F (37.1 C) (Oral)  Resp 16  Ht  (1.575 m)  Wt 111 lb (50.349 kg)  BMI 20.30 kg/m2  SpO2 98%  LMP 08/06/2015    Physical Exam  Constitutional: She is oriented to person, place, and time. She appears well-developed and well-nourished. No distress.  HENT:  Head: Normocephalic and atraumatic.  Mouth/Throat: Posterior oropharyngeal erythema present.  Swollen and erythema of nasal turbinates. Dry lips but moist mucous membranes.   Eyes: Conjunctivae and EOM are normal.  Neck: Neck supple. No tracheal deviation present.  Cardiovascular: Normal rate, regular rhythm and normal heart sounds.    Pulmonary/Chest: Effort normal. No respiratory distress. She exhibits no tenderness.  No anterior chest tenderness.   Musculoskeletal: Normal range of motion.  No back tenderness.   Neurological: She is alert and oriented to person, place, and time.  Skin: Skin is warm and dry.  Psychiatric: She has a normal mood and affect. Her behavior is normal.  Nursing note and vitals reviewed.   ED Course  Procedures (including critical care time) DIAGNOSTIC STUDIES: Oxygen Saturation is 98% on RA, normal by my interpretation.    COORDINATION OF CARE: 2:54 AM-Discussed treatment plan with pt at bedside and pt agreed to plan.     Labs Review Labs Reviewed - No data to display  Imaging Review Dg Chest 2 View  08/19/2015  CLINICAL DATA:  Shortness of breath, chest pressure, nonproductive cough EXAM: CHEST  2 VIEW COMPARISON:  08/27/2011 FINDINGS: The heart size and mediastinal contours are within normal limits. Both lungs are clear. The visualized skeletal structures are unremarkable. IMPRESSION: No active cardiopulmonary disease. Electronically Signed   By: Elige Ko   On: 08/19/2015 02:47      EKG Interpretation None      MDM   Final diagnoses:  Cough    39 year old female seen here previously for asthma exacerbation however her cough is worse and she has a change in the color of her sputum from clear to yellowish green and also has had fevers. Exam relatively benign, chest x-ray normal. However secondary to the fever and the changes sputum production I will prescribe antibiotics. She will follow with her doctor to ensure she is improving otherwise she'll come back here for any new or worsening symptoms.  I have personally and contemperaneously reviewed labs and imaging and used in my decision making as above.   A medical screening exam was performed and I feel the patient has had an appropriate workup for their chief complaint at this time and likelihood of emergent condition  existing is low. They have been counseled on decision, discharge, follow up and which symptoms necessitate immediate return to the emergency department. They or their family verbally stated understanding and agreement with plan and discharged in stable condition.    I personally performed the services described in this documentation, which was scribed in my presence. The recorded information has been reviewed and is accurate.      Marily Memos, MD 08/19/15 [phone]

## 2015-08-19 NOTE — ED Notes (Signed)
Patient here with complaint of cough, congestion, fever(up to 102 @ home). States she was seen here 3 day ago for acute asthma attack. Was prescribed inhaler and prednisone; has been taking as prescribed. Yesterday she began to have the presenting symptoms.

## 2015-12-24 ENCOUNTER — Emergency Department (HOSPITAL_COMMUNITY)
Admission: EM | Admit: 2015-12-24 | Discharge: 2015-12-24 | Disposition: A | Discharge status: Home / Self Care | Payer: BLUE CROSS/BLUE SHIELD | Attending: Emergency Medicine | Admitting: Emergency Medicine

## 2015-12-24 ENCOUNTER — Encounter (HOSPITAL_COMMUNITY): Payer: Self-pay

## 2015-12-24 DIAGNOSIS — Z862 Personal history of diseases of the blood and blood-forming organs and certain disorders involving the immune mechanism: Secondary | ICD-10-CM | POA: Insufficient documentation

## 2015-12-24 DIAGNOSIS — M62830 Muscle spasm of back: Secondary | ICD-10-CM

## 2015-12-24 DIAGNOSIS — R350 Frequency of micturition: Secondary | ICD-10-CM | POA: Insufficient documentation

## 2015-12-24 DIAGNOSIS — Z7952 Long term (current) use of systemic steroids: Secondary | ICD-10-CM | POA: Insufficient documentation

## 2015-12-24 DIAGNOSIS — Z8669 Personal history of other diseases of the nervous system and sense organs: Secondary | ICD-10-CM | POA: Insufficient documentation

## 2015-12-24 DIAGNOSIS — Z79899 Other long term (current) drug therapy: Secondary | ICD-10-CM | POA: Insufficient documentation

## 2015-12-24 DIAGNOSIS — Z8659 Personal history of other mental and behavioral disorders: Secondary | ICD-10-CM | POA: Insufficient documentation

## 2015-12-24 DIAGNOSIS — J45909 Unspecified asthma, uncomplicated: Secondary | ICD-10-CM | POA: Insufficient documentation

## 2015-12-24 DIAGNOSIS — Z880 Allergy status to penicillin: Secondary | ICD-10-CM | POA: Insufficient documentation

## 2015-12-24 DIAGNOSIS — R200 Anesthesia of skin: Secondary | ICD-10-CM | POA: Insufficient documentation

## 2015-12-24 MED ORDER — OXYCODONE-ACETAMINOPHEN 5-325 MG PO TABS
1.0000 | ORAL_TABLET | Freq: Once | ORAL | Status: AC
  Administered 2015-12-24: 1 via ORAL
  Filled 2015-12-24: qty 1

## 2015-12-24 MED ORDER — OXYCODONE-ACETAMINOPHEN 5-325 MG PO TABS: 2.0000 | ORAL_TABLET | ORAL | Status: DC | PRN

## 2015-12-24 MED ORDER — CYCLOBENZAPRINE HCL 10 MG PO TABS: 10.0000 mg | ORAL_TABLET | Freq: Two times a day (BID) | ORAL | Status: DC | PRN

## 2015-12-24 NOTE — ED Notes (Signed)
Patient here with thoracic back pain x 1 week, pain with amny change in position. Denies injury

## 2015-12-24 NOTE — Discharge Instructions (Signed)
Back Exercises °The following exercises strengthen the muscles that help to support the back. They also help to keep the lower back flexible. Doing these exercises can help to prevent back pain or lessen existing pain. °If you have back pain or discomfort, try doing these exercises 2-3 times each day or as told by your health care provider. When the pain goes away, do them once each day, but increase the number of times that you repeat the steps for each exercise (do more repetitions). If you do not have back pain or discomfort, do these exercises once each day or as told by your health care provider. °EXERCISES °Single Knee to Chest °Repeat these steps 3-5 times for each leg: °· Lie on your back on a firm bed or the floor with your legs extended. °· Bring one knee to your chest. Your other leg should stay extended and in contact with the floor. °· Hold your knee in place by grabbing your knee or thigh. °· Pull on your knee until you feel a gentle stretch in your lower back. °· Hold the stretch for 10-30 seconds. °· Slowly release and straighten your leg. °Pelvic Tilt °Repeat these steps 5-10 times: °· Lie on your back on a firm bed or the floor with your legs extended. °· Bend your knees so they are pointing toward the ceiling and your feet are flat on the floor. °· Tighten your lower abdominal muscles to press your lower back against the floor. This motion will tilt your pelvis so your tailbone points up toward the ceiling instead of pointing to your feet or the floor. °· With gentle tension and even breathing, hold this position for 5-10 seconds. °Cat-Cow °Repeat these steps until your lower back becomes more flexible: °· Get into a hands-and-knees position on a firm surface. Keep your hands under your shoulders, and keep your knees under your hips. You may place padding under your knees for comfort. °· Let your head hang down, and point your tailbone toward the floor so your lower back becomes rounded like the  back of a cat. °· Hold this position for 5 seconds. °· Slowly lift your head and point your tailbone up toward the ceiling so your back forms a sagging arch like the back of a cow. °· Hold this position for 5 seconds. °Press-Ups °Repeat these steps 5-10 times: °· Lie on your abdomen (face-down) on the floor. °· Place your palms near your head, about shoulder-width apart. °· While you keep your back as relaxed as possible and keep your hips on the floor, slowly straighten your arms to raise the top half of your body and lift your shoulders. Do not use your back muscles to raise your upper torso. You may adjust the placement of your hands to make yourself more comfortable. °· Hold this position for 5 seconds while you keep your back relaxed. °· Slowly return to lying flat on the floor. °Bridges °Repeat these steps 10 times: °· Lie on your back on a firm surface. °· Bend your knees so they are pointing toward the ceiling and your feet are flat on the floor. °· Tighten your buttocks muscles and lift your buttocks off of the floor until your waist is at almost the same height as your knees. You should feel the muscles working in your buttocks and the back of your thighs. If you do not feel these muscles, slide your feet 1-2 inches farther away from your buttocks. °· Hold this position for 3-5   seconds.  Slowly lower your hips to the starting position, and allow your buttocks muscles to relax completely. If this exercise is too easy, try doing it with your arms crossed over your chest. Abdominal Crunches Repeat these steps 5-10 times: 1. Lie on your back on a firm bed or the floor with your legs extended. 2. Bend your knees so they are pointing toward the ceiling and your feet are flat on the floor. 3. Cross your arms over your chest. 4. Tip your chin slightly toward your chest without bending your neck. 5. Tighten your abdominal muscles and slowly raise your trunk (torso) high enough to lift your shoulder blades  a tiny bit off of the floor. Avoid raising your torso higher than that, because it can put too much stress on your low back and it does not help to strengthen your abdominal muscles. 6. Slowly return to your starting position. Back Lifts Repeat these steps 5-10 times: 1. Lie on your abdomen (face-down) with your arms at your sides, and rest your forehead on the floor. 2. Tighten the muscles in your legs and your buttocks. 3. Slowly lift your chest off of the floor while you keep your hips pressed to the floor. Keep the back of your head in line with the curve in your back. Your eyes should be looking at the floor. 4. Hold this position for 3-5 seconds. 5. Slowly return to your starting position. SEEK MEDICAL CARE IF:  Your back pain or discomfort gets much worse when you do an exercise.  Your back pain or discomfort does not lessen within 2 hours after you exercise. If you have any of these problems, stop doing these exercises right away. Do not do them again unless your health care provider says that you can. SEEK IMMEDIATE MEDICAL CARE IF:  You develop sudden, severe back pain. If this happens, stop doing the exercises right away. Do not do them again unless your health care provider says that you can.   This information is not intended to replace advice given to you by your health care provider. Make sure you discuss any questions you have with your health care provider.   Document Released: 11/27/2004 Document Revised: 07/11/2015 Document Reviewed: 12/14/2014 Elsevier Interactive Patient Education 2016 [employer]  Muscle Cramps and Spasms Muscle cramps and spasms occur when a muscle or muscles tighten and you have no control over this tightening (involuntary muscle contraction). They are a common problem and can develop in any muscle. The most common place is in the calf muscles of the leg. Both muscle cramps and muscle spasms are involuntary muscle contractions, but they also have  differences:   Muscle cramps are sporadic and painful. They may last a few seconds to a quarter of an hour. Muscle cramps are often more forceful and last longer than muscle spasms.  Muscle spasms may or may not be painful. They may also last just a few seconds or much longer. CAUSES  It is uncommon for cramps or spasms to be due to a serious underlying problem. In many cases, the cause of cramps or spasms is unknown. Some common causes are:   Overexertion.   Overuse from repetitive motions (doing the same thing over and over).   Remaining in a certain position for a long period of time.   Improper preparation, form, or technique while performing a sport or activity.   Dehydration.   Injury.   Side effects of some medicines.   Abnormally low levels  of the salts and ions in your blood (electrolytes), especially potassium and calcium. This could happen if you are taking water pills (diuretics) or you are pregnant.  Some underlying medical problems can make it more likely to develop cramps or spasms. These include, but are not limited to:   Diabetes.   Parkinson disease.   Hormone disorders, such as thyroid problems.   Alcohol abuse.   Diseases specific to muscles, joints, and bones.   Blood vessel disease where not enough blood is getting to the muscles.  HOME CARE INSTRUCTIONS   Stay well hydrated. Drink enough water and fluids to keep your urine clear or pale yellow.  It may be helpful to massage, stretch, and relax the affected muscle.  For tight or tense muscles, use a warm towel, heating pad, or hot shower water directed to the affected area.  If you are sore or have pain after a cramp or spasm, applying ice to the affected area may relieve discomfort.  Put ice in a plastic bag.  Place a towel between your skin and the bag.  Leave the ice on for 15-20 minutes, 03-04 times a day.  Medicines used to treat a known cause of cramps or spasms may help  reduce their frequency or severity. Only take over-the-counter or prescription medicines as directed by your caregiver. SEEK MEDICAL CARE IF:  Your cramps or spasms get more severe, more frequent, or do not improve over time.  MAKE SURE YOU:   Understand these instructions.  Will watch your condition.  Will get help right away if you are not doing well or get worse.   This information is not intended to replace advice given to you by your health care provider. Make sure you discuss any questions you have with your health care provider.  Follow-up with her primary care provider if your symptoms do not improve. Take muscle relaxers and pain medication as needed. Apply ice to affected area. Return to the emergency department if you experience severe worsening of her symptoms, bowel or bladder incontinence, numbness or tingling in both of your extremities, inability to walk, fever.

## 2015-12-24 NOTE — ED Provider Notes (Signed)
CSN: [id number]     Arrival date & time 12/24/15  1717 History  By signing my name below, I, Soijett Blue, attest that this documentation has been prepared under the direction and in the presence of Gaylyn Rong, PA-C Electronically Signed: Soijett Blue, ED Scribe. 12/24/2015. 5:55 PM.   Chief Complaint  Patient presents with  . Back Pain      The history is provided by the patient. No language interpreter was used.    HPI Comments: Patricia Arnold is a 40 y.o. female who presents to the Emergency Department complaining of left mid back pain onset thursday. She notes that her mid back pain is worsened with position change and deep breathing. Pt denies any alleviating factors. Pt denies any recent injury/trauma/fall. She notes that she has had sciatic back pain in the past, but never this intense. She reports that the back pain does radiate to her left arm, left sided neck, and left leg and she describes the sensation as numbness. She states that she is having associated symptoms of left sided numbness, urinary frequency, and weakness due to movement. She states that she has tried motrin, advil, heating pad, rest, with no relief for her symptoms. Pt denies bowel/bladder incontinence, fever, chills, and any other symptoms.  Past Medical History  Diagnosis Date  . Anemia   . Headache(784.0)   . Uveitis   . Asthma   . Bipolar depression [facility])    Past Surgical History  Procedure Laterality Date  . Reconstrictive    . Elbow surgery     Family History  Problem Relation Age of Onset  . Other Neg Hx    Social History  Substance Use Topics  . Smoking status: Never Smoker   . Smokeless tobacco: None  . Alcohol Use: No   OB History    Gravida Para Term Preterm AB TAB SAB Ectopic Multiple Living   0 1 0 1 0 0 1     Review of Systems  Gastrointestinal:       No bowel incontinence  Genitourinary: Positive for frequency.       No bladder incontinence  Musculoskeletal:  Positive for back pain. Negative for gait problem.  Skin: Negative for color change, rash and wound.  Neurological: Positive for weakness (due to pain) and numbness.  All other systems reviewed and are negative.     Allergies  Namenda; Penicillins; Sulfa antibiotics; Tizanidine; and Tramadol  Home Medications   Prior to Admission medications   Medication Sig Start Date End Date Taking? Authorizing Provider  azithromycin (ZITHROMAX) 250 MG tablet One daily for four days 08/19/15   Marily Memos, MD  HYDROcodone-homatropine [facility]) 5-1.5 MG/5ML syrup Take 5 mLs by mouth every 6 (six) hours as needed for cough. 08/19/15   Marily Memos, MD  medroxyPROGESTERone (DEPO-PROVERA) 150 MG/ML injection Inject 150 mg into the muscle every 3 (three) months.    Historical Provider, MD  predniSONE (DELTASONE) 20 MG tablet Take 3 tablets (60 mg total) by mouth daily. 08/16/15   Tomasita Crumble, MD  ranitidine (ZANTAC) 150 MG tablet Take 150 mg by mouth 2 (two) times daily.    Historical Provider, MD   BP 117/85 mmHg  Pulse 86  Temp(Src) 97.7 F (36.5 C) (Oral)  Resp 20  Wt 110 lb (49.896 kg)  SpO2 99% Physical Exam  Constitutional: She is oriented to person, place, and time. She appears well-developed and well-nourished. No distress.  HENT:  Head: Normocephalic and atraumatic.  Eyes: Conjunctivae are normal. Right eye exhibits no discharge. Left eye exhibits no discharge. No scleral icterus.  Neck: Normal range of motion. Neck supple.  Cardiovascular: Normal rate.   Pulmonary/Chest: Effort normal.  Musculoskeletal:       Thoracic back: She exhibits pain and spasm. She exhibits no bony tenderness, no swelling and no deformity.       Back:  FROM of C, T, L spine. No step offs. No obvious bony deformities. No midline spinal tenderness.   Lymphadenopathy:    She has no cervical adenopathy.  Neurological: She is alert and oriented to person, place, and time. No cranial nerve deficit. Coordination  normal.  Strength 5/5 throughout. No sensory deficits.  No gait abnormality.  Skin: Skin is warm and dry. No rash noted. She is not diaphoretic. No erythema. No pallor.  Psychiatric: She has a normal mood and affect. Her behavior is normal.  Nursing note and vitals reviewed.   ED Course  Procedures (including critical care time) DIAGNOSTIC STUDIES: Oxygen Saturation is 99% on RA, nl by my interpretation.    COORDINATION OF CARE: 5:54 PM Discussed treatment plan with pt at bedside which includes muscle relaxer, pain medication, continue ibuprofen PRN, and pt agreed to plan.    Labs Review Labs Reviewed - No data to display  Imaging Review No results found.    EKG Interpretation None      MDM   Final diagnoses:  Muscle spasm of back    Patient with back pain. Muscle spasms of L thoracic paraspinal muiscles appreciated. No neurological deficits and normal neuro exam.  Patient is ambulatory.  No loss of bowel or bladder control.  No concern for cauda equina.  No fever, night sweats, weight loss, h/o cancer, IVDA, no recent procedure to back. No urinary symptoms suggestive of UTI.  Supportive care and return precaution discussed. Pt given pain medication and muscle relaxers. Recommend applying ice to affected area. Appears safe for discharge at this time. Follow up as indicated in discharge paperwork.    ,scribe     Dub Mikes, PA-C 12/24/15 1801  Benjiman Core, MD 12/25/15 0030

## 2015-12-24 NOTE — ED Notes (Signed)
Declined W/C at D/C and was escorted to lobby by RN. 

## 2016-04-14 ENCOUNTER — Emergency Department (HOSPITAL_BASED_OUTPATIENT_CLINIC_OR_DEPARTMENT_OTHER)
Admission: EM | Admit: 2016-04-14 | Discharge: 2016-04-14 | Disposition: A | Discharge status: Home / Self Care | Payer: Self-pay | Attending: Emergency Medicine | Admitting: Emergency Medicine

## 2016-04-14 ENCOUNTER — Encounter (HOSPITAL_BASED_OUTPATIENT_CLINIC_OR_DEPARTMENT_OTHER): Payer: Self-pay

## 2016-04-14 ENCOUNTER — Emergency Department (HOSPITAL_BASED_OUTPATIENT_CLINIC_OR_DEPARTMENT_OTHER): Payer: Self-pay

## 2016-04-14 DIAGNOSIS — Y999 Unspecified external cause status: Secondary | ICD-10-CM | POA: Insufficient documentation

## 2016-04-14 DIAGNOSIS — S8992XA Unspecified injury of left lower leg, initial encounter: Secondary | ICD-10-CM | POA: Insufficient documentation

## 2016-04-14 DIAGNOSIS — Y929 Unspecified place or not applicable: Secondary | ICD-10-CM | POA: Insufficient documentation

## 2016-04-14 DIAGNOSIS — F3130 Bipolar disorder, current episode depressed, mild or moderate severity, unspecified: Secondary | ICD-10-CM | POA: Insufficient documentation

## 2016-04-14 DIAGNOSIS — X58XXXA Exposure to other specified factors, initial encounter: Secondary | ICD-10-CM | POA: Insufficient documentation

## 2016-04-14 DIAGNOSIS — F17200 Nicotine dependence, unspecified, uncomplicated: Secondary | ICD-10-CM | POA: Insufficient documentation

## 2016-04-14 DIAGNOSIS — Y9301 Activity, walking, marching and hiking: Secondary | ICD-10-CM | POA: Insufficient documentation

## 2016-04-14 DIAGNOSIS — J45909 Unspecified asthma, uncomplicated: Secondary | ICD-10-CM | POA: Insufficient documentation

## 2016-04-14 MED ORDER — FLUCONAZOLE 150 MG PO TABS: 150.0000 mg | ORAL_TABLET | Freq: Every day | ORAL | Status: DC

## 2016-04-14 MED ORDER — DICLOFENAC SODIUM 50 MG PO TBEC: 50.0000 mg | DELAYED_RELEASE_TABLET | Freq: Two times a day (BID) | ORAL | Status: DC

## 2016-04-14 MED FILL — FLUCONAZOLE 150 MG TABLET: 150 | 1 days supply | Qty: 1 | Fill #0

## 2016-04-14 MED FILL — DICLOFENAC SOD EC 50 MG TAB: 50 | 7 days supply | Qty: 14 | Fill #0

## 2016-04-14 NOTE — Discharge Instructions (Signed)

## 2016-04-14 NOTE — ED Provider Notes (Signed)
CSN: [id number]     Arrival date & time 04/14/16  1238 History   First MD Initiated Contact with Patient 04/14/16 1309     Chief Complaint  Patient presents with  . Knee Pain     (Consider location/radiation/quality/duration/timing/severity/associated sxs/prior Treatment) Patient is a 40 y.o. female presenting with knee pain. The history is provided by the patient. No language interpreter was used.  Knee Pain Location:  Knee Time since incident:  2 hours Injury: yes   Knee location:  L knee Pain details:    Quality:  Aching   Radiates to:  Does not radiate   Severity:  No pain   Onset quality:  Gradual   Timing:  Constant   Progression:  Worsening Chronicity:  New Dislocation: no   Foreign body present:  No foreign bodies Relieved by:  Nothing Worsened by:  Nothing tried Ineffective treatments:  None tried Associated symptoms: no back pain   Risk factors: no concern for non-accidental trauma     Past Medical History  Diagnosis Date  . Anemia   . Headache(784.0)   . Uveitis   . Asthma   . Bipolar depression [facility](HCC)    Past Surgical History  Procedure Laterality Date  . Reconstrictive    . Elbow surgery     Family History  Problem Relation Age of Onset  . Other Neg Hx    Social History  Substance Use Topics  . Smoking status: Current Every Day Smoker  . Smokeless tobacco: None  . Alcohol Use: No   OB History    Gravida Para Term Preterm AB TAB SAB Ectopic Multiple Living   3 1 1  0 1 0 1 0 0 1     Review of Systems  Musculoskeletal: Negative for back pain.  All other systems reviewed and are negative.     Allergies  Namenda; Penicillins; Sulfa antibiotics; Tizanidine; and Tramadol  Home Medications   Prior to Admission medications   Not on File   BP 119/76 mmHg  Pulse 96  Temp(Src) 98.5 F (36.9 C) (Oral)  Resp 16  Ht 5\' 2"  (1.575 m)  Wt 49.896 kg  BMI 20.11 kg/m2  SpO2 98%  LMP 03/08/2016 Physical Exam  Constitutional: She is oriented  to person, place, and time. She appears well-developed and well-nourished.  Musculoskeletal: She exhibits tenderness.  Tender medial knee pain with movement.  Pt unalbe to tolerate stablity testing  Neurological: She is alert and oriented to person, place, and time. She has normal reflexes.  Skin: Skin is warm.  Nursing note and vitals reviewed.   ED Course  Procedures (including critical care time) Labs Review Labs Reviewed - No data to display  Imaging Review Dg Knee Complete 4 Views Left  04/14/2016  CLINICAL DATA:  The patient heard a pop in the left knee when going down stairs this morning with onset of pain. Initial encounter. EXAM: LEFT KNEE - COMPLETE 4+ VIEW COMPARISON:  None. FINDINGS: No evidence of fracture, dislocation, or joint effusion. No evidence of arthropathy or other focal bone abnormality. Soft tissues are unremarkable. IMPRESSION: Negative exam. Electronically Signed   By: Drusilla Kannerhomas  Dalessio M.D.   On: 04/14/2016 13:01   I have personally reviewed and evaluated these images and lab results as part of my medical decision-making.   EKG Interpretation None      MDM   Final diagnoses:  Knee injury, left, initial encounter    Meds ordered this encounter  Medications  . diclofenac (VOLTAREN) 50  MG EC tablet    Sig: Take 1 tablet (50 mg total) by mouth 2 (two) times daily.    Dispense:  14 tablet    Refill:  0    Order Specific Question:  Supervising Provider    Answer:  MILLER, BRIAN [[id number]]  . fluconazole (DIFLUCAN) 150 MG tablet    Sig: Take 1 tablet (150 mg total) by mouth daily.    Dispense:  1 tablet    Refill:  1    Order Specific Question:  Supervising Provider    Answer:  Eber Hong [[id number]]  An After Visit Summary was printed and given to the patient.    Lonia Skinner [place], PA-C 04/14/16 1357  Pricilla Loveless, MD 04/14/16 1501

## 2016-04-14 NOTE — ED Notes (Signed)
PA at bedside.

## 2016-04-14 NOTE — ED Notes (Signed)
C/o left knee popped when walking down steps approx 1.5 hrs PTA-NAD-slow limping gait

## 2016-05-01 ENCOUNTER — Emergency Department (HOSPITAL_COMMUNITY)
Admission: EM | Admit: 2016-05-01 | Discharge: 2016-05-01 | Disposition: A | Discharge status: Home / Self Care | Payer: BLUE CROSS/BLUE SHIELD | Attending: Emergency Medicine | Admitting: Emergency Medicine

## 2016-05-01 ENCOUNTER — Encounter (HOSPITAL_COMMUNITY): Payer: Self-pay | Admitting: Emergency Medicine

## 2016-05-01 DIAGNOSIS — Y929 Unspecified place or not applicable: Secondary | ICD-10-CM | POA: Insufficient documentation

## 2016-05-01 DIAGNOSIS — Y999 Unspecified external cause status: Secondary | ICD-10-CM | POA: Insufficient documentation

## 2016-05-01 DIAGNOSIS — W57XXXA Bitten or stung by nonvenomous insect and other nonvenomous arthropods, initial encounter: Secondary | ICD-10-CM | POA: Insufficient documentation

## 2016-05-01 DIAGNOSIS — J45909 Unspecified asthma, uncomplicated: Secondary | ICD-10-CM | POA: Insufficient documentation

## 2016-05-01 DIAGNOSIS — F17210 Nicotine dependence, cigarettes, uncomplicated: Secondary | ICD-10-CM | POA: Insufficient documentation

## 2016-05-01 DIAGNOSIS — Y939 Activity, unspecified: Secondary | ICD-10-CM | POA: Insufficient documentation

## 2016-05-01 DIAGNOSIS — S70362A Insect bite (nonvenomous), left thigh, initial encounter: Secondary | ICD-10-CM | POA: Insufficient documentation

## 2016-05-01 MED ORDER — DOXYCYCLINE HYCLATE 100 MG PO CAPS: 100.0000 mg | ORAL_CAPSULE | Freq: Two times a day (BID) | ORAL | Status: DC

## 2016-05-01 MED ORDER — VALACYCLOVIR HCL 1 G PO TABS: 1000.0000 mg | ORAL_TABLET | Freq: Three times a day (TID) | ORAL | Status: DC

## 2016-05-01 NOTE — ED Provider Notes (Signed)
CSN: [id number]     Arrival date & time 05/01/16  [id number] History  By signing my name below, I, Patricia Arnold, attest that this documentation has been prepared under the direction and in the presence of Roxy Horsemanobert Aldous Housel, PA-C. Electronically Signed: Doreatha MartinEva Arnold, ED Scribe. 05/01/2016. 10:45 AM.     Chief Complaint  Patient presents with  . Insect Bite   The history is provided by the patient. No language interpreter was used.   HPI Comments: Patricia Arnold is a 40 y.o. female otherwise healthy who presents to the Emergency Department with a chief complaint of a moderate, gradually worsening area of redness, swelling and drainage to the medial left thigh onset today. Pt also reports intermittent stinging pain to the area. She states she believes that she was bitten by a bug, but never saw or felt a bug bite. Pt reports her current symptoms are similar to prior allergic reactions, as the area is itchy along with her hands and feet. No h/o chicken pox or vaccinations. She denies fever, SOB, trouble swallowing. No daily medications.    Past Medical History  Diagnosis Date  . Anemia   . Headache(784.0)   . Uveitis   . Asthma   . Bipolar depression [facility](HCC)    Past Surgical History  Procedure Laterality Date  . Reconstrictive    . Elbow surgery     Family History  Problem Relation Age of Onset  . Other Neg Hx    Social History  Substance Use Topics  . Smoking status: Current Every Day Smoker -- 0.50 packs/day    Types: Cigarettes  . Smokeless tobacco: None  . Alcohol Use: No   OB History    Gravida Para Term Preterm AB TAB SAB Ectopic Multiple Living   3 1 1  0 1 0 1 0 0 1     Review of Systems  Constitutional: Negative for fever.  HENT: Negative for trouble swallowing.   Respiratory: Negative for shortness of breath.   Skin: Positive for color change (redness).       +area of pain, swelling, itching and drainage to medial left thigh   Allergies  Namenda; Penicillins; Sulfa  antibiotics; Tizanidine; and Tramadol  Home Medications   Prior to Admission medications   Medication Sig Start Date End Date Taking? Authorizing Provider  diclofenac (VOLTAREN) 50 MG EC tablet Take 1 tablet (50 mg total) by mouth 2 (two) times daily. 04/14/16   Elson AreasLeslie K Sofia, PA-C  fluconazole (DIFLUCAN) 150 MG tablet Take 1 tablet (150 mg total) by mouth daily. 04/14/16   Elson AreasLeslie K Sofia, PA-C   BP 124/81 mmHg  Pulse 72  Temp(Src) 98.2 F (36.8 C) (Oral)  Resp 18  Ht 5\' 2"  (1.575 m)  Wt 110 lb (49.896 kg)  BMI 20.11 kg/m2  SpO2 100%  LMP 03/08/2016 Physical Exam Physical Exam  Constitutional: Pt is oriented to person, place, and time. Pt appears well-developed and well-nourished. No distress.  HENT:  Head: Normocephalic and atraumatic.  Mouth/Throat: Oropharynx mildly erythematous with mild exudate. No tonsillar abscess.  Eyes: Conjunctivae are normal. No scleral icterus.  Neck: Normal range of motion.  Cardiovascular: Normal rate, regular rhythm and intact distal pulses.   Pulmonary/Chest: Effort normal and breath sounds normal.  Abdominal: Soft. Pt exhibits no distension. There is no tenderness.  Lymphadenopathy:    Pt has no cervical adenopathy.  Neurological: Pt is alert and oriented to person, place, and time.  Skin: Skin is warm and dry. Pt is  not diaphoretic. Left upper thigh remarkable for 1 small vesicular lesion and several surrounding very small vesicular lesions.  Psychiatric: Pt has a normal mood and affect.  Nursing note and vitals reviewed.   ED Course  Procedures (including critical care time) DIAGNOSTIC STUDIES: Oxygen Saturation is 100% on RA, normal by my interpretation.    COORDINATION OF CARE: 10:41 AM Discussed treatment plan with pt at bedside which includes Doxycycline, Valtrex to be taken only if vesicles spread, and pt agreed to plan.    MDM   Final diagnoses:  Insect bite    Patient with possible insect bite to left medial thigh, question  start of herpes zoster, however at this time it appears to be more indicative of an insect bite with mild infection, will treat with doxycycline. Will also provide the patient with a prescription of Valtrex should she develop more vesicles, she is instructed to discontinue doxycycline and begin Valtrex.  I personally performed the services described in this documentation, which was scribed in my presence. The recorded information has been reviewed and is accurate.     Roxy HorsemanRobert Shakti Fleer, PA-C 05/01/16 1109  Marily MemosJason Mesner, MD 05/01/16 1650

## 2016-05-01 NOTE — ED Notes (Signed)
Patient states was bitten by something x 2 days ago.  Patient now has raised, red, draining area to L thigh.   Patient states her palms and bottoms of feet are itchy.   Patient denies paiin.

## 2016-05-01 NOTE — Discharge Instructions (Signed)
It is unclear whether the mark on your leg is an insect bite, infection, or possibly developing shingles virus.  Please take the doxycycline today and tomorrow. If tomorrow you notice more vesicles on your leg, stopped taking the doxycycline, and begin taking valacyclovir.  Please follow-up with your primary care provider in one week.

## 2016-06-23 ENCOUNTER — Encounter (HOSPITAL_COMMUNITY): Payer: Self-pay | Admitting: *Deleted

## 2016-06-23 ENCOUNTER — Ambulatory Visit (HOSPITAL_COMMUNITY)
Admission: EM | Admit: 2016-06-23 | Discharge: 2016-06-23 | Disposition: A | Discharge status: Home / Self Care | Payer: BLUE CROSS/BLUE SHIELD | Attending: Family Medicine | Admitting: Family Medicine

## 2016-06-23 DIAGNOSIS — R21 Rash and other nonspecific skin eruption: Secondary | ICD-10-CM | POA: Diagnosis not present

## 2016-06-23 MED ORDER — PREDNISONE 20 MG PO TABS: ORAL_TABLET | ORAL | 0 refills | Status: DC

## 2016-06-23 NOTE — ED Triage Notes (Signed)
Patient reports dry skin, rash, and "bumps" since 06/29. Patient was seen in the ED for same and states she was given prescription for valtrex. Patient states she has had spots all over her body over the last couple weeks of dry patchy skin, states she has been taking over the counter bendaryl without relief. States her skin feels like it is on fire. Patient skin and symptoms at this time do no appear to be shingles. Patient with 5 small raised areas, states 2 of them had pus in them.

## 2016-06-23 NOTE — ED Provider Notes (Signed)
[facility]    CSN: [id number] Arrival date & time: 06/23/16  1345  First Provider Contact:  First MD Initiated Contact with Patient 06/23/16 1427        History   Chief Complaint Chief Complaint  Patient presents with  . Rash  . Pruritis    HPI Patricia Arnold is a 40 y.o. female.   This is a 40 year old woman with 2 months of dry itchy skin associated with occasional 1 mm papules. She was seen in the emergency room in June and given a prescription for doxycycline which has not helped at all. She never filled the prescription for Valtrex.  She is taking Benadryl without benefit.  Patient has not changed her sheets, detergent, diet, or cosmetics.      Past Medical History:  Diagnosis Date  . Anemia   . Asthma   . Bipolar depression (HCC)   . Headache(784.0)   . Uveitis     Patient Active Problem List   Diagnosis Date Noted  . Adjustment disorder with depressed mood 08/11/2014  . Nausea/vomiting in pregnancy 06/21/2012  . Bacterial vaginosis 06/21/2012  . Viral gastroenteritis 06/21/2012    Past Surgical History:  Procedure Laterality Date  . ELBOW SURGERY    . reconstrictive      OB History    Gravida Para Term Preterm AB Living   3 1 1  0 1 1   SAB TAB Ectopic Multiple Live Births   1 0 0 0         Home Medications    Prior to Admission medications   Medication Sig Start Date End Date Taking? Authorizing Provider  predniSONE (DELTASONE) 20 MG tablet Two daily with food 06/23/16   Elvina SidleKurt Madisen Ludvigsen, MD    Family History Family History  Problem Relation Age of Onset  . Other Neg Hx     Social History Social History  Substance Use Topics  . Smoking status: Current Every Day Smoker    Packs/day: 0.50    Types: Cigarettes  . Smokeless tobacco: Never Used  . Alcohol use No     Comment: occ     Allergies   Namenda [memantine hcl]; Penicillins; Sulfa antibiotics; Tizanidine; and Tramadol   Review of Systems Review of  Systems  Constitutional: Negative.   HENT: Negative.   Eyes: Negative.   Respiratory: Negative.   Cardiovascular: Negative.   Gastrointestinal: Negative.   Skin: Negative for color change, pallor and wound.     Physical Exam Triage Vital Signs ED Triage Vitals  Enc Vitals Group     BP 06/23/16 1421 107/70     Pulse Rate 06/23/16 1421 98     Resp 06/23/16 1421 16     Temp 06/23/16 1421 99.2 F (37.3 C)     Temp Source 06/23/16 1421 Oral     SpO2 06/23/16 1421 98 %     Weight --      Height --      Head Circumference --      Peak Flow --      Pain Score 06/23/16 1423 10     Pain Loc --      Pain Edu? --      Excl. in GC? --    No data found.   Updated Vital Signs BP 107/70 (BP Location: Left Arm)   Pulse 98   Temp 99.2 F (37.3 C) (Oral)   Resp 16   SpO2 98%   Visual Acuity Right Eye Distance:  Left Eye Distance:   Bilateral Distance:    Right Eye Near:   Left Eye Near:    Bilateral Near:     Physical Exam  Constitutional: She appears well-developed and well-nourished.  HENT:  Head: Normocephalic.  Eyes: Pupils are equal, round, and reactive to light.  Neck: Normal range of motion. Neck supple.  Skin: Skin is warm and dry.  No obvious rash or exfoliation.  Several randomly distributed torso papules about 1 mm in diabetes.  Nursing note and vitals reviewed.    UC Treatments / Results  Labs (all labs ordered are listed, but only abnormal results are displayed) Labs Reviewed - No data to display  EKG  EKG Interpretation None       Radiology No results found.  Procedures Procedures (including critical care time)  Medications Ordered in UC Medications - No data to display   Initial Impression / Assessment and Plan / UC Course  I have reviewed the triage vital signs and the nursing notes.  Pertinent labs & imaging results that were available during my care of the patient were reviewed by me and considered in my medical decision making  (see chart for details).  Clinical Course    Final Clinical Impressions(s) / UC Diagnoses   Final diagnoses:  Rash and nonspecific skin eruption    New Prescriptions New Prescriptions   PREDNISONE (DELTASONE) 20 MG TABLET    Two daily with food     Elvina SidleKurt Mckensi Redinger, MD 06/23/16 1440

## 2016-07-02 ENCOUNTER — Emergency Department (HOSPITAL_COMMUNITY)
Admission: EM | Admit: 2016-07-02 | Discharge: 2016-07-03 | Disposition: A | Discharge status: Home / Self Care | Payer: Self-pay | Attending: Emergency Medicine | Admitting: Emergency Medicine

## 2016-07-02 ENCOUNTER — Encounter (HOSPITAL_COMMUNITY): Payer: Self-pay | Admitting: Emergency Medicine

## 2016-07-02 ENCOUNTER — Emergency Department (HOSPITAL_COMMUNITY): Payer: Self-pay

## 2016-07-02 DIAGNOSIS — J45909 Unspecified asthma, uncomplicated: Secondary | ICD-10-CM | POA: Insufficient documentation

## 2016-07-02 DIAGNOSIS — R0789 Other chest pain: Secondary | ICD-10-CM | POA: Insufficient documentation

## 2016-07-02 DIAGNOSIS — F17210 Nicotine dependence, cigarettes, uncomplicated: Secondary | ICD-10-CM | POA: Insufficient documentation

## 2016-07-02 LAB — CBC
HCT: 38.9 % (ref 36.0–46.0)
HEMOGLOBIN: 13.5 g/dL (ref 12.0–15.0)
MCH: 29.6 pg (ref 26.0–34.0)
MCHC: 34.7 g/dL (ref 30.0–36.0)
MCV: 85.3 fL (ref 78.0–100.0)
PLATELETS: 263 10*3/uL (ref 150–400)
RBC: 4.56 MIL/uL (ref 3.87–5.11)
RDW: 13.5 % (ref 11.5–15.5)
WBC: 9.4 10*3/uL (ref 4.0–10.5)

## 2016-07-02 LAB — BASIC METABOLIC PANEL
ANION GAP: 7 (ref 5–15)
BUN: 11 mg/dL (ref 6–20)
CHLORIDE: 110 mmol/L (ref 101–111)
CO2: 22 mmol/L (ref 22–32)
CREATININE: 0.76 mg/dL (ref 0.44–1.00)
Calcium: 9.8 mg/dL (ref 8.9–10.3)
GFR calc non Af Amer: >60 mL/min (ref >60)
Glucose, Bld: 100 mg/dL — ABNORMAL HIGH (ref 65–99)
POTASSIUM: 3.1 mmol/L — AB (ref 3.5–5.1)
SODIUM: 139 mmol/L (ref 135–145)

## 2016-07-02 LAB — I-STAT TROPONIN, ED: Troponin i, poc: 0 ng/mL (ref 0.00–0.08)

## 2016-07-02 NOTE — ED Notes (Signed)
Pt updated on wait time, requesting to be re-evaluated. Brought back to triage for re-assessment by RN.

## 2016-07-02 NOTE — ED Triage Notes (Signed)
Patient with chest pain and shortness of breath.  Patient states she has a pressure in her back that radiates to her chest.  She is asthmatic, but her shortness of breath increased and is worse than usual.

## 2016-07-03 ENCOUNTER — Emergency Department (HOSPITAL_COMMUNITY): Payer: Self-pay

## 2016-07-03 ENCOUNTER — Encounter (HOSPITAL_COMMUNITY): Payer: Self-pay | Admitting: Radiology

## 2016-07-03 MED ORDER — PREDNISONE 10 MG PO TABS: 20.0000 mg | ORAL_TABLET | Freq: Every day | ORAL | 0 refills | Status: DC

## 2016-07-03 MED ORDER — POTASSIUM CHLORIDE CRYS ER 20 MEQ PO TBCR
40.0000 meq | EXTENDED_RELEASE_TABLET | Freq: Once | ORAL | Status: AC
  Administered 2016-07-03: 40 meq via ORAL
  Filled 2016-07-03: qty 2

## 2016-07-03 MED ORDER — ONDANSETRON HCL 4 MG/2ML IJ SOLN
4.0000 mg | Freq: Once | INTRAMUSCULAR | Status: AC
Start: 2016-07-03 — End: 2016-07-03
  Administered 2016-07-03: 4 mg via INTRAVENOUS
  Filled 2016-07-03: qty 2

## 2016-07-03 MED ORDER — HYDROCODONE-ACETAMINOPHEN 5-325 MG PO TABS: 1.0000 | ORAL_TABLET | ORAL | 0 refills | Status: DC | PRN

## 2016-07-03 MED ORDER — IOPAMIDOL (ISOVUE-370) INJECTION 76%
INTRAVENOUS | Status: AC
  Administered 2016-07-03: 100 mL
  Filled 2016-07-03: qty 100

## 2016-07-03 MED ORDER — MORPHINE SULFATE (PF) 4 MG/ML IV SOLN
4.0000 mg | Freq: Once | INTRAVENOUS | Status: AC
  Administered 2016-07-03: 4 mg via INTRAVENOUS
  Filled 2016-07-03: qty 1

## 2016-07-03 NOTE — ED Notes (Signed)
Provider at bedside

## 2016-07-03 NOTE — ED Provider Notes (Signed)
[facility]   CSN: [id number] Arrival date & time: 07/02/16  2106     History   Chief Complaint Chief Complaint  Patient presents with  . Chest Pain  . Shortness of Breath    HPI Patricia Arnold is a 40 y.o. female.  HPI   Patient has a PMH of anemia, asthma, bipolar depression, HA and uveitis.  She comes to the ER with complaints of chest wall pain, upper back pain and SOB. Her symptoms started yesterday evening and are different then an asthma exacerbation, not having any wheezing. She is unable to move her neck around to to worsening pain. Cannot move her arms over her chest due to worsening pain. Does not want anyone to touch her chest and or back or arms because it worsens the pain. She is tearful. She says that exertions worsens her SOb.  Denies that this has happened before. Denies cough, fevers, N/V/D. Denies LOC, hx of heart problems or similar. No LE swelling or pain.  Past Medical History:  Diagnosis Date  . Anemia   . Asthma   . Bipolar depression (HCC)   . Headache(784.0)   . Uveitis     Patient Active Problem List   Diagnosis Date Noted  . Adjustment disorder with depressed mood 08/11/2014  . Nausea/vomiting in pregnancy 06/21/2012  . Bacterial vaginosis 06/21/2012  . Viral gastroenteritis 06/21/2012    Past Surgical History:  Procedure Laterality Date  . ELBOW SURGERY    . reconstrictive      OB History    Gravida Para Term Preterm AB Living   4 1 1  0 1 1   SAB TAB Ectopic Multiple Live Births   1 0 0 0         Home Medications    Prior to Admission medications   Medication Sig Start Date End Date Taking? Authorizing Provider  HYDROcodone-acetaminophen (NORCO/VICODIN) 5-325 MG tablet Take 1-2 tablets by mouth every 4 (four) hours as needed. 07/03/16   Jennaya Pogue Neva Seat, PA-C  predniSONE (DELTASONE) 10 MG tablet Take 2 tablets (20 mg total) by mouth daily. Prednisone dose pack directions:   5 tabs on day one, 4  tabs on day two, 3 tabs on day three, 2 tabs on day four, 1 tab on day five. Disp # [id number] PA-C 07/03/16   Marlon Pel, PA-C    Family History Family History  Problem Relation Age of Onset  . Other Neg Hx     Social History Social History  Substance Use Topics  . Smoking status: Current Every Day Smoker    Packs/day: 0.50    Types: Cigarettes  . Smokeless tobacco: Never Used  . Alcohol use No     Comment: occ     Allergies   Namenda [memantine hcl]; Penicillins; Sulfa antibiotics; Tizanidine; and Tramadol   Review of Systems Review of Systems  Review of Systems All other systems negative except as documented in the HPI. All pertinent positives and negatives as reviewed in the HPI.  Physical Exam Updated Vital Signs BP 112/85   Pulse 78   Temp 98.2 F (36.8 C)   Resp 18   Ht 5\' 2"  (1.575 m)   Wt 49.9 kg   LMP 07/02/2016 (Exact Date)   SpO2 99%   BMI 20.12 kg/m   Physical Exam  Constitutional: She appears well-developed and well-nourished. Distressed: tearful.  HENT:  Head: Normocephalic and atraumatic.  Eyes: Conjunctivae are normal. Pupils are equal, round,  and reactive to light.  Neck: Trachea normal, normal range of motion and full passive range of motion without pain. Neck supple.  Cardiovascular: Normal rate, regular rhythm and normal pulses.   Pulmonary/Chest: Effort normal and breath sounds normal. No apnea, no tachypnea and no bradypnea. No respiratory distress. Chest wall is not dull to percussion. She exhibits no tenderness, no crepitus, no edema, no deformity and no retraction.  Very tender to anterior and posterior chest wall to palpation. Cannot tolerate rotation of chest or laying back due to worsening of pain. No rash or lesions noted. Refuses to raise shoulders, due to worsening of pain. Endorses relief of pain when she sits up straight.  Abdominal: Soft. Normal appearance and bowel sounds are normal.  Musculoskeletal: Normal  range of motion.  No LE.  Neurological: She is alert. She has normal strength.  Skin: Skin is warm, dry and intact.  Psychiatric: She has a normal mood and affect. Her speech is normal and behavior is normal. Judgment and thought content normal. Cognition and memory are normal.     ED Treatments / Results  Labs (all labs ordered are listed, but only abnormal results are displayed) Labs Reviewed  BASIC METABOLIC PANEL - Abnormal; Notable for the following:       Result Value   Potassium 3.1 (*)    Glucose, Bld 100 (*)    All other components within normal limits  CBC  I-STAT TROPOININ, ED    EKG  EKG Interpretation None       Radiology Dg Chest 2 View  Result Date: 07/02/2016 CLINICAL DATA:  Chest and back pain tonight. Shortness of breath onset 2 days ago. History of asthma. EXAM: CHEST  2 VIEW COMPARISON:  08/19/2015 FINDINGS: The heart size and mediastinal contours are within normal limits. Both lungs are clear. The visualized skeletal structures are unremarkable. IMPRESSION: No active cardiopulmonary disease. Electronically Signed   By: Burman NievesWilliam  Stevens M.D.   On: 07/02/2016 21:49   Ct Angio Chest Pe W Or Wo Contrast  Result Date: 07/03/2016 CLINICAL DATA:  Chest pain and dyspnea EXAM: CT ANGIOGRAPHY CHEST WITH CONTRAST TECHNIQUE: Multidetector CT imaging of the chest was performed using the standard protocol during bolus administration of intravenous contrast. Multiplanar CT image reconstructions and MIPs were obtained to evaluate the vascular anatomy. CONTRAST:  100 mL Isovue 370 intravenous COMPARISON:  Radiographs 07/02/2016 FINDINGS: Cardiovascular: There is good opacification of the pulmonary arteries. There is no pulmonary embolism. The thoracic aorta is normal in caliber and intact. Lungs: Clear Central airways: Patent and normal in caliber Effusions: None Lymphadenopathy: None Esophagus: Unremarkable Upper abdomen: No significant abnormality. Incompletely imaged left  renal cyst, seen to better advantage on the abdominal CT of 06/12/2014. Musculoskeletal: No significant skeletal lesion Review of the MIP images confirms the above findings. IMPRESSION: No significant abnormality Electronically Signed   By: Ellery Plunkaniel R Mitchell M.D.   On: 07/03/2016 03:15    Procedures Procedures (including critical care time)  Medications Ordered in ED Medications  morphine 4 MG/ML injection 4 mg (4 mg Intravenous Given 07/03/16 0125)  ondansetron (ZOFRAN) injection 4 mg (4 mg Intravenous Given 07/03/16 0125)  iopamidol (ISOVUE-370) 76 % injection (100 mLs  Contrast Given 07/03/16 0145)  potassium chloride SA (K-DUR,KLOR-CON) CR tablet 40 mEq (40 mEq Oral Given 07/03/16 0332)     Initial Impression / Assessment and Plan / ED Course  I have reviewed the triage vital signs and the nursing notes.  Pertinent labs & imaging results that  were available during my care of the patient were reviewed by me and considered in my medical decision making (see chart for details).  Clinical Course    Patient is to be discharged with recommendation to follow up with PCP in regards to today's hospital visit. Chest pain is not likely of cardiac or pulmonary etiology d/t presentation, perc negative, VSS, no tracheal deviation, no JVD or new murmur, RRR, breath sounds equal bilaterally, EKG without acute abnormalities, negative troponin, and negative CXR, normal CT angio. Pt has been advised start prednisone and return to the ED is CP becomes exertional, associated with diaphoresis or nausea, radiates to left jaw/arm, worsens or becomes concerning in any way. Pt appears reliable for follow up and is agreeable to discharge.     Final Clinical Impressions(s) / ED Diagnoses   Final diagnoses:  Chest wall pain    New Prescriptions Discharge Medication List as of 07/03/2016  3:35 AM    START taking these medications   Details  HYDROcodone-acetaminophen (NORCO/VICODIN) 5-325 MG tablet Take 1-2  tablets by mouth every 4 (four) hours as needed., Starting Thu 07/03/2016, Print    predniSONE (DELTASONE) 10 MG tablet Take 2 tablets (20 mg total) by mouth daily. Prednisone dose pack directions:   5 tabs on day one, 4 tabs on day two, 3 tabs on day three, 2 tabs on day four, 1 tab on day five. Disp # [id number]  Marlon Pel, PA-C, Starting Thu 07/03/2016, Print         Marlon Pel, PA-C 07/03/16 0503    Gilda Crease, MD 07/03/16 [phone]

## 2016-08-19 ENCOUNTER — Encounter (HOSPITAL_COMMUNITY): Payer: Self-pay | Admitting: Emergency Medicine

## 2016-08-19 ENCOUNTER — Ambulatory Visit (HOSPITAL_COMMUNITY)
Admission: EM | Admit: 2016-08-19 | Discharge: 2016-08-19 | Disposition: A | Discharge status: Home / Self Care | Payer: BLUE CROSS/BLUE SHIELD | Attending: Family Medicine | Admitting: Family Medicine

## 2016-08-19 DIAGNOSIS — H6982 Other specified disorders of Eustachian tube, left ear: Secondary | ICD-10-CM | POA: Diagnosis not present

## 2016-08-19 DIAGNOSIS — G43701 Chronic migraine without aura, not intractable, with status migrainosus: Secondary | ICD-10-CM

## 2016-08-19 MED ORDER — MECLIZINE HCL 25 MG PO TABS: 25.0000 mg | ORAL_TABLET | Freq: Three times a day (TID) | ORAL | 0 refills | Status: DC | PRN

## 2016-08-19 NOTE — ED Triage Notes (Signed)
Headache for 3 weeks per patient.  Today has left ear with popping sounds.  Patient relates this to any facial movement: swallowing, blinking eyes.  Patient feels like something in left ear, but nothing is there.  Patient does not use q-tips or any other items in ears.  Inside of jaws are raw, suspected from grinding teeth at night.  Reports "ringing, snap crackle"

## 2016-08-19 NOTE — Discharge Instructions (Signed)
Recommend Meclizine every 6 hours as needed for ear discomfort. May try Afrin nasal spray 2 to 3 times a day as needed- 3 days only. Follow-up with an ENT if symptoms do not improve within 3 days

## 2016-08-19 NOTE — ED Provider Notes (Signed)
CSN: [id number]     Arrival date & time 08/19/16  1408 History   First MD Initiated Contact with Patient 08/19/16 1552     Chief Complaint  Patient presents with  . Otalgia   (Consider location/radiation/quality/duration/timing/severity/associated sxs/prior Treatment) 40 year old female presents with chronic migraine headaches. This current headache has been occurring for the past 3 weeks. Has seen a Neurologist and tried on multiple migraine medication with minimal relief. Usually drinks Mt Dew (caffeine) and eats black walnuts and pain improves. Today left ear started popping and was uncomfortable with any head movement. Has not tried any medication for symptoms. Has history of occasional seasonal allergies but does not take any daily medication. Denies any fever, URI symptoms, visual changes, syncope or numbness.       Past Medical History:  Diagnosis Date  . Anemia   . Asthma   . Bipolar depression (HCC)   . Headache(784.0)   . Uveitis    Past Surgical History:  Procedure Laterality Date  . ELBOW SURGERY    . reconstrictive     Family History  Problem Relation Age of Onset  . Other Neg Hx    Social History  Substance Use Topics  . Smoking status: Current Every Day Smoker    Packs/day: 0.50    Types: Cigarettes  . Smokeless tobacco: Never Used  . Alcohol use No     Comment: occ   OB History    Gravida Para Term Preterm AB Living   4 1 1  0 1 1   SAB TAB Ectopic Multiple Live Births   1 0 0 0       Review of Systems  Constitutional: Negative for chills, fatigue and fever.  HENT: Positive for tinnitus. Negative for congestion, ear discharge, ear pain, rhinorrhea, sinus pressure, sneezing and sore throat.   Eyes: Negative for photophobia, discharge and visual disturbance.  Respiratory: Negative for cough and shortness of breath.   Cardiovascular: Negative for chest pain.  Gastrointestinal: Positive for nausea. Negative for abdominal pain, diarrhea and vomiting.   Musculoskeletal: Negative for back pain, neck pain and neck stiffness.  Skin: Negative for rash and wound.  Allergic/Immunologic: Positive for environmental allergies.  Neurological: Positive for light-headedness and headaches. Negative for dizziness, syncope, weakness and numbness.  Hematological: Negative for adenopathy.    Allergies  Namenda [memantine hcl]; Penicillins; Sulfa antibiotics; Tizanidine; and Tramadol  Home Medications   Prior to Admission medications   Medication Sig Start Date End Date Taking? Authorizing Provider  meclizine (ANTIVERT) 25 MG tablet Take 1 tablet (25 mg total) by mouth 3 (three) times daily as needed (for ear discomfort). 08/19/16   Sudie GrumblingAnn Berry Auset Fritzler, NP   Meds Ordered and Administered this Visit  Medications - No data to display  BP 117/71 (BP Location: Left Arm)   Pulse 84   Temp 98.9 F (37.2 C) (Oral)   Resp 16   LMP 07/02/2016 (Exact Date)   SpO2 100%  No data found.   Physical Exam  Constitutional: She is oriented to person, place, and time. She appears well-developed and well-nourished. No distress.  HENT:  Head: Normocephalic and atraumatic.  Right Ear: Hearing, tympanic membrane, external ear and ear canal normal.  Left Ear: External ear and ear canal normal. No drainage, swelling or tenderness. Tympanic membrane is bulging. A middle ear effusion (clear fluid) is present. Decreased hearing is noted.  Nose: Nose normal. Right sinus exhibits no maxillary sinus tenderness and no frontal sinus tenderness. Left sinus  exhibits no maxillary sinus tenderness and no frontal sinus tenderness.  Mouth/Throat: Uvula is midline, oropharynx is clear and moist and mucous membranes are normal.  Eyes: EOM are normal. Pupils are equal, round, and reactive to light.  Neck: Normal range of motion. Neck supple.  Cardiovascular: Normal rate and regular rhythm.   Pulmonary/Chest: Effort normal and breath sounds normal.  Musculoskeletal: Normal range of  motion.  Lymphadenopathy:    She has no cervical adenopathy.  Neurological: She is alert and oriented to person, place, and time. She has normal strength. No cranial nerve deficit or sensory deficit.  Skin: Skin is warm and dry. Capillary refill takes less than 2 seconds. No rash noted.  Psychiatric: She has a normal mood and affect. Her behavior is normal. Judgment and thought content normal.    Urgent Care Course   Clinical Course    Procedures (including critical care time)  Labs Review Labs Reviewed - No data to display  Imaging Review No results found.   Visual Acuity Review  Right Eye Distance:   Left Eye Distance:   Bilateral Distance:    Right Eye Near:   Left Eye Near:    Bilateral Near:         MDM   1. Dysfunction of left eustachian tube   2. Chronic migraine without aura with status migrainosus, not intractable    Discussed clinical findings related to eustachian tube- may trial Meclizine 25mg  every 8 hours as needed. Recommend Afrin nasal spray 2 sprays each nostril twice a day for 3 days only. May need to see ENT for further evaluation. Number for [facility] provided. Discussed that we can try Toradol injection today for headache but patient declined- wants to continue with home remedies. May need to follow-up with her Neurologist if headache does not improve. Recommend follow-up with ENT if current ear symptoms do not improve within 3 days or go to ER if symptoms worsen.     Sudie Grumbling, NP 08/19/16 [phone]

## 2016-12-07 ENCOUNTER — Emergency Department (HOSPITAL_BASED_OUTPATIENT_CLINIC_OR_DEPARTMENT_OTHER)
Admission: EM | Admit: 2016-12-07 | Discharge: 2016-12-07 | Disposition: A | Discharge status: Home / Self Care | Payer: BLUE CROSS/BLUE SHIELD | Attending: Emergency Medicine | Admitting: Emergency Medicine

## 2016-12-07 ENCOUNTER — Encounter (HOSPITAL_BASED_OUTPATIENT_CLINIC_OR_DEPARTMENT_OTHER): Payer: Self-pay | Admitting: Emergency Medicine

## 2016-12-07 DIAGNOSIS — Z791 Long term (current) use of non-steroidal anti-inflammatories (NSAID): Secondary | ICD-10-CM | POA: Diagnosis not present

## 2016-12-07 DIAGNOSIS — J45909 Unspecified asthma, uncomplicated: Secondary | ICD-10-CM | POA: Diagnosis not present

## 2016-12-07 DIAGNOSIS — R202 Paresthesia of skin: Secondary | ICD-10-CM

## 2016-12-07 DIAGNOSIS — F17210 Nicotine dependence, cigarettes, uncomplicated: Secondary | ICD-10-CM | POA: Insufficient documentation

## 2016-12-07 DIAGNOSIS — R200 Anesthesia of skin: Secondary | ICD-10-CM | POA: Insufficient documentation

## 2016-12-07 MED ORDER — PREDNISONE 10 MG (21) PO TBPK: ORAL_TABLET | ORAL | 0 refills | Status: DC

## 2016-12-07 MED ORDER — GABAPENTIN 300 MG PO CAPS: 300.0000 mg | ORAL_CAPSULE | Freq: Three times a day (TID) | ORAL | 0 refills | Status: DC

## 2016-12-07 MED ORDER — METHOCARBAMOL 500 MG PO TABS: 500.0000 mg | ORAL_TABLET | Freq: Two times a day (BID) | ORAL | 0 refills | Status: DC

## 2016-12-07 NOTE — ED Notes (Signed)
[facility] and has been experiencing swelling, numbness, tingling and cold sensation to hands for a couple weeks. [facility] but detoured here because she "couldn't grip the steering wheel and it was raining."

## 2016-12-07 NOTE — ED Notes (Signed)
ED Provider at bedside. 

## 2016-12-07 NOTE — ED Triage Notes (Signed)
Pain, numbness and swelling, weakness to hands and pain radiates to arms, x 2 weeks. 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

## 2016-12-07 NOTE — Discharge Instructions (Signed)
Please follow up with your hand specialist as soon as possible. Take the prednisone until finished. Take the Neurontin and Robaxin for pain and muscle spasms. Avoid using the Robaxin while driving or performing other dangerous activities.
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
## 2017-02-15 ENCOUNTER — Encounter (HOSPITAL_COMMUNITY): Payer: Self-pay | Admitting: Emergency Medicine

## 2017-02-15 ENCOUNTER — Emergency Department (HOSPITAL_COMMUNITY)
Admission: EM | Admit: 2017-02-15 | Discharge: 2017-02-15 | Disposition: A | Discharge status: Home / Self Care | Payer: BLUE CROSS/BLUE SHIELD | Attending: Emergency Medicine | Admitting: Emergency Medicine

## 2017-02-15 DIAGNOSIS — F17210 Nicotine dependence, cigarettes, uncomplicated: Secondary | ICD-10-CM | POA: Diagnosis not present

## 2017-02-15 DIAGNOSIS — S199XXA Unspecified injury of neck, initial encounter: Secondary | ICD-10-CM | POA: Diagnosis present

## 2017-02-15 DIAGNOSIS — Y999 Unspecified external cause status: Secondary | ICD-10-CM | POA: Insufficient documentation

## 2017-02-15 DIAGNOSIS — Z23 Encounter for immunization: Secondary | ICD-10-CM | POA: Insufficient documentation

## 2017-02-15 DIAGNOSIS — Y939 Activity, unspecified: Secondary | ICD-10-CM | POA: Diagnosis not present

## 2017-02-15 DIAGNOSIS — S50812A Abrasion of left forearm, initial encounter: Secondary | ICD-10-CM | POA: Diagnosis not present

## 2017-02-15 DIAGNOSIS — Y929 Unspecified place or not applicable: Secondary | ICD-10-CM | POA: Diagnosis not present

## 2017-02-15 DIAGNOSIS — J45909 Unspecified asthma, uncomplicated: Secondary | ICD-10-CM | POA: Insufficient documentation

## 2017-02-15 DIAGNOSIS — T71193A Asphyxiation due to mechanical threat to breathing due to other causes, assault, initial encounter: Secondary | ICD-10-CM

## 2017-02-15 DIAGNOSIS — Z79899 Other long term (current) drug therapy: Secondary | ICD-10-CM | POA: Diagnosis not present

## 2017-02-15 LAB — POC URINE PREG, ED: Preg Test, Ur: NEGATIVE

## 2017-02-15 MED ORDER — NAPROXEN SODIUM 220 MG PO TABS: 880.0000 mg | ORAL_TABLET | Freq: Two times a day (BID) | ORAL | 0 refills | Status: DC

## 2017-02-15 MED ORDER — TETANUS-DIPHTH-ACELL PERTUSSIS 5-2.5-18.5 LF-MCG/0.5 IM SUSP
0.5000 mL | Freq: Once | INTRAMUSCULAR | Status: AC
Start: 2017-02-15 — End: 2017-02-15
  Administered 2017-02-15: 0.5 mL via INTRAMUSCULAR
  Filled 2017-02-15: qty 0.5

## 2017-02-15 MED ORDER — METHOCARBAMOL 500 MG PO TABS
500.0000 mg | ORAL_TABLET | Freq: Two times a day (BID) | ORAL | 0 refills | Status: DC
Start: 2017-02-15 — End: 2017-04-30

## 2017-02-15 MED ORDER — IBUPROFEN 200 MG PO TABS
600.0000 mg | ORAL_TABLET | Freq: Once | ORAL | Status: AC
  Administered 2017-02-15: 600 mg via ORAL
  Filled 2017-02-15: qty 1
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
## 2017-02-18 ENCOUNTER — Ambulatory Visit (INDEPENDENT_AMBULATORY_CARE_PROVIDER_SITE_OTHER): Payer: Self-pay

## 2017-02-18 ENCOUNTER — Other Ambulatory Visit: Payer: Self-pay | Admitting: Rheumatology

## 2017-02-18 ENCOUNTER — Ambulatory Visit (INDEPENDENT_AMBULATORY_CARE_PROVIDER_SITE_OTHER): Payer: BLUE CROSS/BLUE SHIELD | Admitting: Rheumatology

## 2017-02-18 ENCOUNTER — Encounter: Payer: Self-pay | Admitting: Rheumatology

## 2017-02-18 VITALS — BP 90/58 | HR 80 | Resp 14 | Ht 62.0 in | Wt 115.0 lb

## 2017-02-18 DIAGNOSIS — M533 Sacrococcygeal disorders, not elsewhere classified: Secondary | ICD-10-CM

## 2017-02-18 DIAGNOSIS — M79671 Pain in right foot: Secondary | ICD-10-CM

## 2017-02-18 DIAGNOSIS — G8929 Other chronic pain: Secondary | ICD-10-CM

## 2017-02-18 DIAGNOSIS — Z9889 Other specified postprocedural states: Secondary | ICD-10-CM

## 2017-02-18 DIAGNOSIS — Z832 Family history of diseases of the blood and blood-forming organs and certain disorders involving the immune mechanism: Secondary | ICD-10-CM

## 2017-02-18 DIAGNOSIS — H209 Unspecified iridocyclitis: Secondary | ICD-10-CM

## 2017-02-18 DIAGNOSIS — M79642 Pain in left hand: Secondary | ICD-10-CM | POA: Diagnosis not present

## 2017-02-18 DIAGNOSIS — D573 Sickle-cell trait: Secondary | ICD-10-CM

## 2017-02-18 DIAGNOSIS — Z8709 Personal history of other diseases of the respiratory system: Secondary | ICD-10-CM

## 2017-02-18 DIAGNOSIS — M79672 Pain in left foot: Secondary | ICD-10-CM | POA: Diagnosis not present

## 2017-02-18 DIAGNOSIS — Z8669 Personal history of other diseases of the nervous system and sense organs: Secondary | ICD-10-CM | POA: Diagnosis not present

## 2017-02-18 DIAGNOSIS — M25561 Pain in right knee: Secondary | ICD-10-CM

## 2017-02-18 DIAGNOSIS — M25562 Pain in left knee: Secondary | ICD-10-CM

## 2017-02-18 DIAGNOSIS — Z1159 Encounter for screening for other viral diseases: Secondary | ICD-10-CM

## 2017-02-18 DIAGNOSIS — M2550 Pain in unspecified joint: Secondary | ICD-10-CM | POA: Diagnosis not present

## 2017-02-18 DIAGNOSIS — F17200 Nicotine dependence, unspecified, uncomplicated: Secondary | ICD-10-CM

## 2017-02-18 DIAGNOSIS — E559 Vitamin D deficiency, unspecified: Secondary | ICD-10-CM | POA: Insufficient documentation

## 2017-02-18 DIAGNOSIS — F4321 Adjustment disorder with depressed mood: Secondary | ICD-10-CM

## 2017-02-18 DIAGNOSIS — M170 Bilateral primary osteoarthritis of knee: Secondary | ICD-10-CM | POA: Insufficient documentation

## 2017-02-18 DIAGNOSIS — M8700 Idiopathic aseptic necrosis of unspecified bone: Secondary | ICD-10-CM | POA: Diagnosis not present

## 2017-02-18 DIAGNOSIS — M79641 Pain in right hand: Secondary | ICD-10-CM

## 2017-02-19 LAB — ANGIOTENSIN CONVERTING ENZYME: ANGIOTENSIN-CONVERTING ENZYME: 32 U/L (ref 9–67)

## 2017-02-19 LAB — PAN-ANCA
ANCA Screen: NEGATIVE
Myeloperoxidase Abs: <1

## 2017-02-19 LAB — CP5000020 ENA PANEL
ENA SM Ab Ser-aCnc: <1
Ribonucleic Protein(ENA) Antibody, IgG: <1
SCLERODERMA (SCL-70) (ENA) ANTIBODY, IGG: NEGATIVE
SSA (RO) (ENA) ANTIBODY, IGG: NEGATIVE
SSB (LA) (ENA) ANTIBODY, IGG: NEGATIVE

## 2017-02-19 LAB — CK: CK TOTAL: 598 U/L — AB (ref 7–177)

## 2017-02-19 LAB — HEPATITIS PANEL, ACUTE
HCV AB: NEGATIVE
HEP A IGM: NONREACTIVE
HEP B C IGM: NONREACTIVE
HEP B S AG: NEGATIVE

## 2017-02-22 LAB — 14-3-3 ETA PROTEIN: 14-3-3 eta Protein: <0.2 ng/mL (ref <0.2)

## 2017-02-23 LAB — PROTEIN ELECTROPHORESIS, SERUM, WITH REFLEX
ALBUMIN ELP: 4.2 g/dL (ref 3.8–4.8)
Alpha-1-Globulin: 0.3 g/dL (ref 0.2–0.3)
Alpha-2-Globulin: 0.7 g/dL (ref 0.5–0.9)
Beta 2: 0.3 g/dL (ref 0.2–0.5)
Beta Globulin: 0.4 g/dL (ref 0.4–0.6)
Gamma Globulin: 1 g/dL (ref 0.8–1.7)
TOTAL PROTEIN, SERUM ELECTROPHOR: 7 g/dL (ref 6.1–8.1)

## 2017-02-25 LAB — HLA-B27 ANTIGEN: DNA Result:: NEGATIVE

## 2017-02-25 LAB — TSH: TSH: 0.63 m[IU]/L

## 2017-02-25 NOTE — Progress Notes (Signed)
Add myositis assess panel

## 2017-02-25 NOTE — Progress Notes (Signed)
TSH normal

## 2017-02-25 NOTE — Progress Notes (Signed)
Add TSH

## 2017-03-02 ENCOUNTER — Encounter (INDEPENDENT_AMBULATORY_CARE_PROVIDER_SITE_OTHER): Payer: Self-pay | Admitting: *Deleted

## 2017-03-03 ENCOUNTER — Encounter: Payer: Self-pay | Admitting: Rheumatology

## 2017-03-09 NOTE — Progress Notes (Signed)
Office Visit Note  Patient: Patricia Arnold             Date of Birth: [birth year]           MRN: [id number]             PCP: Lindie Spruce, MD Referring: No ref. provider found Visit Date: 03/16/2017 Occupation: _0 @    Subjective:  Bilateral hands and feet    History of Present Illness: Patricia Arnold is a 41 y.o. female with history of polyarthralgias. She states she continues to have pain and discomfort in her bilateral hands and bilateral feet. She believes her hands are swollen. She also has stiffness in the morning. She has difficulty making a fist. She is also having some lower back pain which she describes as sciatic on the left side. She feels pain in her bilateral forearm muscles. She has no difficulty getting out of the chair. She has difficulty walking due to feet discomfort.   Activities of Daily Living:  Patient reports morning stiffness for 6 hours.   Patient Reports nocturnal pain.  Difficulty dressing/grooming: Reports Difficulty climbing stairs: Reports Difficulty getting out of chair: Reports Difficulty using hands for taps, buttons, cutlery, and/or writing: Reports   Review of Systems  Constitutional: Positive for fatigue and weakness. Negative for night sweats.  HENT: Positive for mouth dryness. Negative for mouth sores and nose dryness.   Eyes: Negative for pain, redness, itching, visual disturbance and dryness.  Respiratory: Negative for cough, shortness of breath and difficulty breathing.   Cardiovascular: Negative for chest pain, palpitations, hypertension, irregular heartbeat and swelling in legs/feet.  Gastrointestinal: Negative for blood in stool, constipation and diarrhea.  Endocrine: Negative for increased urination.  Genitourinary: Negative for painful urination and vaginal dryness.  Musculoskeletal: Positive for arthralgias, joint pain, joint swelling, morning stiffness and muscle tenderness. Negative for myalgias and myalgias.    Skin: Negative for color change, rash, nodules/bumps, redness, skin tightness, ulcers and sensitivity to sunlight.  Allergic/Immunologic: Negative for susceptible to infections.  Neurological: Negative for dizziness, headaches, memory loss and night sweats.  Hematological: Negative for swollen glands.  Psychiatric/Behavioral: Positive for sleep disturbance. Negative for depressed mood. The patient is not nervous/anxious.     PMFS History:  Patient Active Problem List   Diagnosis Date Noted  . Avascular necrosis left knee 03/12/2017  . Polyarthralgia 02/18/2017  . Uveitis 02/18/2017  . Status post reconstruction of ligament of knee joint 02/18/2017  . Sickle cell trait ([place]) 02/18/2017  . History of asthma 02/18/2017  . History of migraine 02/18/2017  . Smoker 02/18/2017  . Vitamin D deficiency 02/18/2017  . Primary osteoarthritis of both knees 02/18/2017  . Adjustment disorder with depressed mood 08/11/2014  . Nausea/vomiting in pregnancy 06/21/2012  . Bacterial vaginosis 06/21/2012  . Viral gastroenteritis 06/21/2012    Past Medical History:  Diagnosis Date  . Anemia   . Asthma   . Bipolar depression ([place])   . Headache(784.0)   . Uveitis     Family History  Problem Relation Age of Onset  . Other Neg Hx    Past Surgical History:  Procedure Laterality Date  . ELBOW SURGERY    . reconstrictive     Social History   Social History Narrative  . No narrative on file     Objective: Vital Signs: BP (!) 90/54   Pulse 81   Resp 12   Ht _1  (1.575 m)   Wt 120 lb (54.4 kg)  LMP 03/15/2017   BMI 21.95 kg/m    Physical Exam  Constitutional: She is oriented to person, place, and time. She appears well-developed and well-nourished.  HENT:  Head: Normocephalic and atraumatic.  Eyes: Conjunctivae and EOM are normal.  Neck: Normal range of motion.  Cardiovascular: Normal rate, regular rhythm, normal heart sounds and intact distal pulses.   Pulmonary/Chest: Effort  normal and breath sounds normal.  Abdominal: Soft. Bowel sounds are normal.  Lymphadenopathy:    She has no cervical adenopathy.  Neurological: She is alert and oriented to person, place, and time.  Skin: Skin is warm and dry. Capillary refill takes less than 2 seconds.  Psychiatric: She has a normal mood and affect. Her behavior is normal.  Nursing note and vitals reviewed.    Musculoskeletal Exam: C-spine and thoracic lumbar spine good range of motion. Shoulder joints elbow joints wrist joint MCPs were good range of motion. She has difficulty making a fist with bilateral hands. She has tenderness across her bilateral PIP joints. She has tenderness some on her left fifth MCP joint. No synovitis was appreciated. Hip joints knee joints ankles MTPs PIPs DIPs with good range of motion. She describes tenderness across the medial aspect of her bilateral feet over plantar fascia and Achilles tendon area. She had tenderness on palpation over bilateral forearm. She's no difficulty getting up from chair. The muscle strength was 5 over 5 in all extremities.  CDAI Exam: CDAI Homunculus Exam:   Tenderness:  Right hand: 3rd PIP, 4th PIP and 5th PIP Left hand: 5th MCP, 1st PIP and 5th PIP  Joint Counts:  CDAI Tender Joint count: 6 CDAI Swollen Joint count: 0  Global Assessments:  Patient Global Assessment: 10 Provider Global Assessment: 5  CDAI Calculated Score: 21    Investigation: Findings:  01/02/2017 CMP normal CBC normal LDL 119, TSH normal, vitamin D 22.8, PTH normal, ESR 2, ANA negative, RF less than 7, anti-CCP 2 negative 02/18/2017 CK Elevated 598,  tSH, Pan-ANCA, ENA PANEL,14-3-3 eta Protein, Angiotensin converting enzyme,HLA-B27 antigen,Hepatitis panel, acute, and SPEP all normal/ negative           Imaging: Xr Foot 2 Views Left  Result Date: 02/18/2017 Minimal PIP/DIP narrowing was noted. No MTP narrowing was noted. No erosive changes were noted. Impression: These findings  are consistent with mild osteoarthritis   Xr Foot 2 Views Right  Result Date: 02/18/2017 Minimal PIP/DIP narrowing was noted. No MTP narrowing was noted. No erosive changes were noted. Impression: These findings are consistent with mild osteoarthritis   Xr Hand 2 View Left  Result Date: 02/18/2017 No MCP PIP/DIP narrowing was noted. No intercarpal joint space narrowing was noted. No erosive changes were noted. Impression: These findings were consistent with normal x-ray of hand  Xr Hand 2 View Right  Result Date: 02/18/2017 No MCP PIP/DIP narrowing was noted. No intercarpal joint space narrowing was noted. No erosive changes were noted. Impression: These findings were consistent with normal x-ray of hand  Xr Knee 3 View Left  Result Date: 02/18/2017 Moderate patellofemoral narrowing no chondrocalcinosis. Moderate medial compartment narrowing. Impression: Findings are consistent with moderate osteoarthritis and moderate chondromalacia patella  Xr Knee 3 View Right  Result Date: 02/18/2017 Moderate patellofemoral narrowing no chondrocalcinosis. Moderate medial compartment narrowing. Impression: Findings are consistent with moderate osteoarthritis and moderate chondromalacia patella   Speciality Comments: No specialty comments available.    Procedures:  No procedures performed Allergies: Namenda [memantine hcl]; Penicillins; Sulfa antibiotics; Tizanidine; and Tramadol  Assessment / Plan:     Visit Diagnoses: Polyarthralgia -she's been having pain and stiffness in her hands and feet. Incomplete fist formation. She is tenderness across her PIP joints. I do not appreciate any synovitis on examination. RF negative, anti-CCP negative, 14 33 eta negative, ace negative, para ANCA negative ANA negative, ESR normal, HLA-B27 negative. I'll schedule ultrasound of bilateral hands next week. At this point I did not initiate any treatment.  Uveitis - Recurrent bilateral uveitis. Last episode  2016.  Elevated CK - CK 598. Patient reports no history of injury or muscle trauma. I would like to repeat CK and aldolase. If her CKs again elevated she will need myositis assessment panel and further workup.  Primary osteoarthritis of both knees: She had no swelling or warmth in her knee joints.  Avascular necrosis left knee  Status post reconstruction of ligament of knee joint  Vitamin D deficiency: I'll call and vitamin D 50,000 units once a week for 90 days and repeat her vitamin D levels in 3 months.  Smoker  Sickle cell trait (HCC)  History of migraine  History of asthma  Repeat CK aldolase myositis panel. Schedule ultrasound bilateral hands.  Orders: Orders Placed This Encounter  Procedures  . CK  . Aldolase  . Quantiferon tb gold assay (blood)  . Thiopurine methyltransferase(tpmt)rbc  . HIV antibody   Meds ordered this encounter  Medications  . Vitamin D, Ergocalciferol, (DRISDOL) 50000 units CAPS capsule    Sig: Take 1 capsule (50,000 Units total) by mouth every 7 (seven) days.    Dispense:  12 capsule    Refill:  0    Face-to-face time spent with patient was 30 minutes. 50% of time was spent in counseling and coordination of care.  Follow-Up Instructions: Return for polyarthralgia, elevated CK.   Bo Merino, MD  Note - This record has been created using Editor, commissioning.  Chart creation errors have been sought, but may not always  have been located. Such creation errors do not reflect on  the standard of medical care.

## 2017-03-12 ENCOUNTER — Encounter: Payer: Self-pay | Admitting: Rheumatology

## 2017-03-12 ENCOUNTER — Telehealth: Payer: Self-pay | Admitting: *Deleted

## 2017-03-12 DIAGNOSIS — M8700 Idiopathic aseptic necrosis of unspecified bone: Secondary | ICD-10-CM | POA: Insufficient documentation

## 2017-03-12 NOTE — Telephone Encounter (Signed)
Patient states she received a letter from our office stating she needed a pelvic x-ray. Patient went to [facility] this morning and they did not have her order.   Does Ms. Nelis need a pelvic X-ray? If so will make patient aware she needs to be seen [facility] [place] for the x-ray and will place order.

## 2017-03-12 NOTE — Telephone Encounter (Signed)
The order went into wrong facility. Please, explain and apologize.

## 2017-03-12 NOTE — Telephone Encounter (Signed)
Attempted to contact patient and left message for patient to call the office.  

## 2017-03-13 ENCOUNTER — Telehealth: Payer: Self-pay | Admitting: Rheumatology

## 2017-03-13 NOTE — Telephone Encounter (Signed)
Patient returning Andrea's call. °

## 2017-03-13 NOTE — Telephone Encounter (Signed)
Patient advised that the X-ray order went to wrong facility and the order had been cancelled. Patient advised if she is still having discomfort and the x-ray is still needed we can do it here at the office at her follow up visit. Patient verbalized understanding.

## 2017-03-13 NOTE — Telephone Encounter (Signed)
See previous phone call note.  

## 2017-03-13 NOTE — Telephone Encounter (Signed)
Attempted to contact the patient and left message for patient to call the office.  

## 2017-03-16 ENCOUNTER — Ambulatory Visit (INDEPENDENT_AMBULATORY_CARE_PROVIDER_SITE_OTHER): Payer: BLUE CROSS/BLUE SHIELD | Admitting: Rheumatology

## 2017-03-16 ENCOUNTER — Encounter: Payer: Self-pay | Admitting: Rheumatology

## 2017-03-16 VITALS — BP 90/54 | HR 81 | Resp 12 | Ht 62.0 in | Wt 120.0 lb

## 2017-03-16 DIAGNOSIS — Z8709 Personal history of other diseases of the respiratory system: Secondary | ICD-10-CM

## 2017-03-16 DIAGNOSIS — Z9889 Other specified postprocedural states: Secondary | ICD-10-CM

## 2017-03-16 DIAGNOSIS — R748 Abnormal levels of other serum enzymes: Secondary | ICD-10-CM | POA: Diagnosis not present

## 2017-03-16 DIAGNOSIS — M8700 Idiopathic aseptic necrosis of unspecified bone: Secondary | ICD-10-CM

## 2017-03-16 DIAGNOSIS — Z79899 Other long term (current) drug therapy: Secondary | ICD-10-CM

## 2017-03-16 DIAGNOSIS — F17200 Nicotine dependence, unspecified, uncomplicated: Secondary | ICD-10-CM | POA: Diagnosis not present

## 2017-03-16 DIAGNOSIS — H209 Unspecified iridocyclitis: Secondary | ICD-10-CM | POA: Diagnosis not present

## 2017-03-16 DIAGNOSIS — D573 Sickle-cell trait: Secondary | ICD-10-CM | POA: Diagnosis not present

## 2017-03-16 DIAGNOSIS — M2550 Pain in unspecified joint: Secondary | ICD-10-CM

## 2017-03-16 DIAGNOSIS — Z8669 Personal history of other diseases of the nervous system and sense organs: Secondary | ICD-10-CM | POA: Diagnosis not present

## 2017-03-16 DIAGNOSIS — M170 Bilateral primary osteoarthritis of knee: Secondary | ICD-10-CM

## 2017-03-16 DIAGNOSIS — E559 Vitamin D deficiency, unspecified: Secondary | ICD-10-CM

## 2017-03-16 MED ORDER — VITAMIN D (ERGOCALCIFEROL) 1.25 MG (50000 UNIT) PO CAPS: 50000.0000 [IU] | ORAL_CAPSULE | ORAL | 0 refills | Status: DC

## 2017-03-17 LAB — CK: Total CK: 121 U/L (ref 29–143)

## 2017-03-17 LAB — HIV ANTIBODY (ROUTINE TESTING W REFLEX): HIV 1&2 Ab, 4th Generation: NONREACTIVE

## 2017-03-18 LAB — QUANTIFERON TB GOLD ASSAY (BLOOD)
Interferon Gamma Release Assay: NEGATIVE
Mitogen-Nil: 8.65 IU/mL
QUANTIFERON TB AG MINUS NIL: 0 [IU]/mL
Quantiferon Nil Value: 0.02 IU/mL

## 2017-03-18 LAB — ALDOLASE: Aldolase: 2.8 U/L (ref <=8.1)

## 2017-03-18 NOTE — Progress Notes (Deleted)
03/16/2017 Assessment / Plan:     Visit Diagnoses: Polyarthralgia -she's been having pain and stiffness in her hands and feet. Incomplete fist formation. She is tenderness across her PIP joints. I do not appreciate any synovitis on examination. RF negative, anti-CCP negative, 14 33 eta negative, ace negative, para ANCA negative ANA negative, ESR normal, HLA-B27 negative. I'll schedule ultrasound of bilateral hands next week. At this point I did not initiate any treatment.  Uveitis - Recurrent bilateral uveitis. Last episode 2016.  Elevated CK - CK 598. Patient reports no history of injury or muscle trauma. I would like to repeat CK and aldolase. If her CKs again elevated she will need myositis assessment panel and further workup.  Primary osteoarthritis of both knees: She had no swelling or warmth in her knee joints.  Avascular necrosis left knee  Status post reconstruction of ligament of knee joint  Vitamin D deficiency: I'll call and vitamin D 50,000 units once a week for 90 days and repeat her vitamin D levels in 3 months.  Smoker  Sickle cell trait (HCC)  History of migraine  History of asthma  Repeat CK aldolase myositis panel. Schedule ultrasound bilateral hands.  Orders:    Orders Placed This Encounter  Procedures  . CK  . Aldolase  . Quantiferon tb gold assay (blood)  . Thiopurine methyltransferase(tpmt)rbc  . HIV antibody       Meds ordered this encounter  Medications  . Vitamin D, Ergocalciferol, (DRISDOL) 50000 units CAPS capsule    Sig: Take 1 capsule (50,000 Units total) by mouth every 7 (seven) days.    Dispense:  12 capsule    Refill:  0

## 2017-03-20 ENCOUNTER — Ambulatory Visit: Payer: Self-pay | Admitting: Rheumatology

## 2017-03-23 NOTE — Progress Notes (Signed)
CK normal

## 2017-03-25 ENCOUNTER — Ambulatory Visit (INDEPENDENT_AMBULATORY_CARE_PROVIDER_SITE_OTHER): Payer: BLUE CROSS/BLUE SHIELD | Admitting: Rheumatology

## 2017-03-25 ENCOUNTER — Inpatient Hospital Stay (INDEPENDENT_AMBULATORY_CARE_PROVIDER_SITE_OTHER): Payer: BLUE CROSS/BLUE SHIELD

## 2017-03-25 DIAGNOSIS — M79641 Pain in right hand: Secondary | ICD-10-CM | POA: Diagnosis not present

## 2017-03-25 DIAGNOSIS — M79642 Pain in left hand: Secondary | ICD-10-CM | POA: Diagnosis not present

## 2017-03-25 DIAGNOSIS — M79672 Pain in left foot: Secondary | ICD-10-CM

## 2017-03-25 DIAGNOSIS — M79671 Pain in right foot: Secondary | ICD-10-CM

## 2017-03-25 NOTE — Progress Notes (Signed)
Ultrasound examination of bilateral hands was performed per EULAR recommendations. Using 12 MHz transducer, grayscale and power Doppler bilateral second, third, and fifth MCP joints and bilateral wrist joints both dorsal and volar aspects were evaluated to look for synovitis or tenosynovitis. The findings were there was no synovitis or tenosynovitis on ultrasound examination. Right median nerve was 0.07 cm squares which was within normal limits and left median nerve was 0.08 cm squares which was within normal limits.  Ultrasound examination of bilateral feet was performed per EULAR recommendations. Using 12 MHz transducer grayscale and power Doppler bilateral first, second, fourth and fifth MTP joints and bilateral plantar fascia were examined. There was no evidence of synovitis or tenosynovitis in the MTP joints. No no tenosynovitis of plantar fascia was noted.   Pollyann SavoyShaili Oleda Borski, MD, Aileen PilotFACR

## 2017-03-26 LAB — THIOPURINE METHYLTRANSFERASE (TPMT), RBC: THIOPURINE METHYLTRANSFERASE, RBC: 11 nmol/h/mL — AB

## 2017-03-27 NOTE — Progress Notes (Signed)
Will discuss at follow up visit

## 2017-04-01 ENCOUNTER — Ambulatory Visit: Payer: BLUE CROSS/BLUE SHIELD | Admitting: Rheumatology

## 2017-04-14 ENCOUNTER — Encounter: Payer: Self-pay | Admitting: Rheumatology

## 2017-04-14 NOTE — Telephone Encounter (Signed)
Needs f/u appt 

## 2017-04-24 NOTE — Progress Notes (Signed)
Office Visit Note  Patient: Patricia Arnold             Date of Birth: 1976/02/23           MRN: [id number]             PCP: Lindie Spruce, MD Referring: Lindie Spruce, MD Visit Date: 04/30/2017 Occupation: _0 @    Subjective:  Medication Management. Pain in joints   History of Present Illness: Patricia Arnold is a 41 y.o. female with history of multiple arthralgias. She continues to have pain and discomfort in her bilateral hands and bilateral knees. She complains of discomfort in her bilateral feet as well. All autoimmune labs were negative. An ultrasound was negative as well. She's noticed improvement in her fatigue all she is taking vitamin D. She denies any episodes of uveitis and a long time.  Activities of Daily Living:  Patient reports morning stiffness for 1-2 hours.   Patient Denies nocturnal pain.  Difficulty dressing/grooming: Denies Difficulty climbing stairs: Denies Difficulty getting out of chair: Denies Difficulty using hands for taps, buttons, cutlery, and/or writing: Denies   Review of Systems  Constitutional: Negative for fatigue, night sweats, weight gain, weight loss and weakness.  HENT: Positive for mouth dryness. Negative for mouth sores, trouble swallowing, trouble swallowing and nose dryness.   Eyes: Negative for pain, redness, visual disturbance and dryness.  Respiratory: Negative for cough, shortness of breath and difficulty breathing.   Cardiovascular: Negative for chest pain, palpitations, hypertension, irregular heartbeat and swelling in legs/feet.  Gastrointestinal: Negative for blood in stool, constipation and diarrhea.  Endocrine: Negative for increased urination.  Genitourinary: Negative for vaginal dryness.  Musculoskeletal: Positive for arthralgias, joint pain and morning stiffness. Negative for joint swelling, myalgias, muscle weakness, muscle tenderness and myalgias.  Skin: Negative for color change, rash, hair loss, skin  tightness, ulcers and sensitivity to sunlight.  Allergic/Immunologic: Negative for susceptible to infections.  Neurological: Negative for dizziness, memory loss and night sweats.  Hematological: Negative for swollen glands.  Psychiatric/Behavioral: Positive for depressed mood and sleep disturbance. The patient is not nervous/anxious.     PMFS History:  Patient Active Problem List   Diagnosis Date Noted  . Primary osteoarthritis of both feet 04/29/2017  . Avascular necrosis left knee 03/12/2017  . Polyarthralgia 02/18/2017  . Uveitis 02/18/2017  . Status post reconstruction of ligament of knee joint 02/18/2017  . Sickle cell trait ([place]) 02/18/2017  . History of asthma 02/18/2017  . History of migraine 02/18/2017  . Smoker 02/18/2017  . Vitamin D deficiency 02/18/2017  . Primary osteoarthritis of both knees 02/18/2017  . Adjustment disorder with depressed mood 08/11/2014  . Nausea/vomiting in pregnancy 06/21/2012  . Bacterial vaginosis 06/21/2012  . Viral gastroenteritis 06/21/2012    Past Medical History:  Diagnosis Date  . Anemia   . Asthma   . Bipolar depression ([place])   . Headache(784.0)   . Uveitis     Family History  Problem Relation Age of Onset  . Other Neg Hx    Past Surgical History:  Procedure Laterality Date  . ELBOW SURGERY    . reconstrictive     Social History   Social History Narrative  . No narrative on file     Objective: Vital Signs: BP 104/62   Pulse 68   Resp 16   Ht _1  (1.575 m)   Wt 116 lb (52.6 kg)   LMP 04/15/2017   BMI 21.22 kg/m    Physical Exam  Constitutional: She is oriented to person, place, and time. She appears well-developed and well-nourished.  HENT:  Head: Normocephalic and atraumatic.  Eyes: Conjunctivae and EOM are normal.  Neck: Normal range of motion.  Cardiovascular: Normal rate, regular rhythm, normal heart sounds and intact distal pulses.   Pulmonary/Chest: Effort normal and breath sounds normal.    Abdominal: Soft. Bowel sounds are normal.  Lymphadenopathy:    She has no cervical adenopathy.  Neurological: She is alert and oriented to person, place, and time.  Skin: Skin is warm and dry. Capillary refill takes less than 2 seconds.  Psychiatric: She has a normal mood and affect. Her behavior is normal.  Nursing note and vitals reviewed.    Musculoskeletal Exam: C-spine and thoracic lumbar spine good range of motion. Shoulder joints elbow joints wrist joint MCPs PIPs DIPs are good range of motion with no synovitis. Hip joints knee joints ankles MTPs PIPs DIPs are good range of motion with no synovitis. She had no febrile myalgias tender points on examination.  CDAI Exam: No CDAI exam completed.    Investigation: Findings:  01/02/2017 CMP normal CBC normal LDL 119, TSH normal, vitamin D 22.8, PTH normal, ESR 2, ANA negative, RF less than 7, anti-CCP 2 negative  02/18/2017 CK  Elevated 598,  TSH, Pan-ANCA, ENA PANEL,14-3-3 eta Protein, Angiotensin converting enzyme,HLA-B27 antigen,Hepatitis panel, acute, and SPEP all normal/ negative  03/16/17 CK  Normal 121, Aldolase normal, TB gold negative, HIV non reactive, and TMPT normal    Imaging: No results found.  Speciality Comments: No specialty comments available.    Procedures:  No procedures performed Allergies: Namenda [memantine hcl]; Penicillins; Sulfa antibiotics; Tizanidine; and Tramadol   Assessment / Plan:     Visit Diagnoses: Polyarthralgia - all autoimmune work up negative. [place] of bilateral hands and feet negative. Labs and ultrasound findings were discussed with patient at length.  Uveitis - last episode 2016  Primary osteoarthritis of both hands: She has mild osteoarthritis. No synovitis was noted. I've given her a handout on hand muscle strengthening exercises. A list of anti-inflammatories was given.  Primary osteoarthritis of both knees: She continues to have some discomfort in her knee joints. Avascular  necrosis left knee  Primary osteoarthritis of both feet - bilateral mild, [place] negative for synovitis: Proper fitting shoes were discussed.  Vitamin D deficiency - repeat Vit D level in 8/18 she is on vitamin D and will need repeat vitamin D level in August. Smoker  History of asthma  History of migraine  Sickle cell trait ([place])    Orders: No orders of the defined types were placed in this encounter.  No orders of the defined types were placed in this encounter.   Face-to-face time spent with patient was 30 minutes. 50% of time was spent in counseling and coordination of care.  Follow-Up Instructions: No Follow-up on file.   Bo Merino, MD  Note - This record has been created using Editor, commissioning.  Chart creation errors have been sought, but may not always  have been located. Such creation errors do not reflect on  the standard of medical care.

## 2017-04-29 DIAGNOSIS — M19072 Primary osteoarthritis, left ankle and foot: Secondary | ICD-10-CM

## 2017-04-29 DIAGNOSIS — M19071 Primary osteoarthritis, right ankle and foot: Secondary | ICD-10-CM | POA: Insufficient documentation

## 2017-04-30 ENCOUNTER — Ambulatory Visit (INDEPENDENT_AMBULATORY_CARE_PROVIDER_SITE_OTHER): Payer: BLUE CROSS/BLUE SHIELD | Admitting: Rheumatology

## 2017-04-30 ENCOUNTER — Encounter: Payer: Self-pay | Admitting: Rheumatology

## 2017-04-30 VITALS — BP 104/62 | HR 68 | Resp 16 | Ht 62.0 in | Wt 116.0 lb

## 2017-04-30 DIAGNOSIS — D573 Sickle-cell trait: Secondary | ICD-10-CM | POA: Diagnosis not present

## 2017-04-30 DIAGNOSIS — M19041 Primary osteoarthritis, right hand: Secondary | ICD-10-CM | POA: Diagnosis not present

## 2017-04-30 DIAGNOSIS — E559 Vitamin D deficiency, unspecified: Secondary | ICD-10-CM

## 2017-04-30 DIAGNOSIS — Z8709 Personal history of other diseases of the respiratory system: Secondary | ICD-10-CM | POA: Diagnosis not present

## 2017-04-30 DIAGNOSIS — M8700 Idiopathic aseptic necrosis of unspecified bone: Secondary | ICD-10-CM | POA: Diagnosis not present

## 2017-04-30 DIAGNOSIS — F17200 Nicotine dependence, unspecified, uncomplicated: Secondary | ICD-10-CM | POA: Diagnosis not present

## 2017-04-30 DIAGNOSIS — Z8669 Personal history of other diseases of the nervous system and sense organs: Secondary | ICD-10-CM

## 2017-04-30 DIAGNOSIS — M19071 Primary osteoarthritis, right ankle and foot: Secondary | ICD-10-CM | POA: Diagnosis not present

## 2017-04-30 DIAGNOSIS — M2550 Pain in unspecified joint: Secondary | ICD-10-CM | POA: Diagnosis not present

## 2017-04-30 DIAGNOSIS — M19042 Primary osteoarthritis, left hand: Secondary | ICD-10-CM | POA: Diagnosis not present

## 2017-04-30 DIAGNOSIS — M170 Bilateral primary osteoarthritis of knee: Secondary | ICD-10-CM | POA: Diagnosis not present

## 2017-04-30 DIAGNOSIS — H209 Unspecified iridocyclitis: Secondary | ICD-10-CM | POA: Diagnosis not present

## 2017-04-30 DIAGNOSIS — M19072 Primary osteoarthritis, left ankle and foot: Secondary | ICD-10-CM

## 2017-04-30 NOTE — Patient Instructions (Signed)
Natural anti-inflammatories  You can purchase these at [employer], [employer] or online.  . Turmeric (capsules)  . Ginger (ginger root or capsules)  . Omega 3 (Fish, flax seeds, chia seeds, walnuts, almonds)  . Tart cherry (dried or extract)   Patient should be under the care of a physician while taking these supplements. This may not be reproduced without the permission of Dr. Anise Harbin.  Knee Exercises Ask your health care provider which exercises are safe for you. Do exercises exactly as told by your health care provider and adjust them as directed. It is normal to feel mild stretching, pulling, tightness, or discomfort as you do these exercises, but you should stop right away if you feel sudden pain or your pain gets worse.Do not begin these exercises until told by your health care provider. STRETCHING AND RANGE OF MOTION EXERCISES These exercises warm up your muscles and joints and improve the movement and flexibility of your knee. These exercises also help to relieve pain, numbness, and tingling. Exercise A: Knee Extension, Prone 1. Lie on your abdomen on a bed. 2. Place your left / right knee just beyond the edge of the surface so your knee is not on the bed. You can put a towel under your left / right thigh just above your knee for comfort. 3. Relax your leg muscles and allow gravity to straighten your knee. You should feel a stretch behind your left / right knee. 4. Hold this position for __________ seconds. 5. Scoot up so your knee is supported between repetitions. Repeat __________ times. Complete this stretch __________ times a day. Exercise B: Knee Flexion, Active  1. Lie on your back with both knees straight. If this causes back discomfort, bend your left / right knee so your foot is flat on the floor. 2. Slowly slide your left / right heel back toward your buttocks until you feel a gentle stretch in the front of your knee or thigh. 3. Hold this position for  __________ seconds. 4. Slowly slide your left / right heel back to the starting position. Repeat __________ times. Complete this exercise __________ times a day. Exercise C: Quadriceps, Prone  1. Lie on your abdomen on a firm surface, such as a bed or padded floor. 2. Bend your left / right knee and hold your ankle. If you cannot reach your ankle or pant leg, loop a belt around your foot and grab the belt instead. 3. Gently pull your heel toward your buttocks. Your knee should not slide out to the side. You should feel a stretch in the front of your thigh and knee. 4. Hold this position for __________ seconds. Repeat __________ times. Complete this stretch __________ times a day. Exercise D: Hamstring, Supine 1. Lie on your back. 2. Loop a belt or towel over the ball of your left / right foot. The ball of your foot is on the walking surface, right under your toes. 3. Straighten your left / right knee and slowly pull on the belt to raise your leg until you feel a gentle stretch behind your knee. ? Do not let your left / right knee bend while you do this. ? Keep your other leg flat on the floor. 4. Hold this position for __________ seconds. Repeat __________ times. Complete this stretch __________ times a day. STRENGTHENING EXERCISES These exercises build strength and endurance in your knee. Endurance is the ability to use your muscles for a long time, even after they get tired. Exercise E:   Quadriceps, Isometric  1. Lie on your back with your left / right leg extended and your other knee bent. Put a rolled towel or small pillow under your knee if told by your health care provider. 2. Slowly tense the muscles in the front of your left / right thigh. You should see your kneecap slide up toward your hip or see increased dimpling just above the knee. This motion will push the back of the knee toward the floor. 3. For __________ seconds, keep the muscle as tight as you can without increasing your  pain. 4. Relax the muscles slowly and completely. Repeat __________ times. Complete this exercise __________ times a day. Exercise F: Straight Leg Raises - Quadriceps 1. Lie on your back with your left / right leg extended and your other knee bent. 2. Tense the muscles in the front of your left / right thigh. You should see your kneecap slide up or see increased dimpling just above the knee. Your thigh may even shake a bit. 3. Keep these muscles tight as you raise your leg 4-6 inches (10-15 cm) off the floor. Do not let your knee bend. 4. Hold this position for __________ seconds. 5. Keep these muscles tense as you lower your leg. 6. Relax your muscles slowly and completely after each repetition. Repeat __________ times. Complete this exercise __________ times a day. Exercise G: Hamstring, Isometric 1. Lie on your back on a firm surface. 2. Bend your left / right knee approximately __________ degrees. 3. Dig your left / right heel into the surface as if you are trying to pull it toward your buttocks. Tighten the muscles in the back of your thighs to dig as hard as you can without increasing any pain. 4. Hold this position for __________ seconds. 5. Release the tension gradually and allow your muscles to relax completely for __________ seconds after each repetition. Repeat __________ times. Complete this exercise __________ times a day. Exercise H: Hamstring Curls  If told by your health care provider, do this exercise while wearing ankle weights. Begin with __________ weights. Then increase the weight by 1 lb (0.5 kg) increments. Do not wear ankle weights that are more than __________. 1. Lie on your abdomen with your legs straight. 2. Bend your left / right knee as far as you can without feeling pain. Keep your hips flat against the floor. 3. Hold this position for __________ seconds. 4. Slowly lower your leg to the starting position.  Repeat __________ times. Complete this exercise  __________ times a day. Exercise I: Squats (Quadriceps) 1. Stand in front of a table, with your feet and knees pointing straight ahead. You may rest your hands on the table for balance but not for support. 2. Slowly bend your knees and lower your hips like you are going to sit in a chair. ? Keep your weight over your heels, not over your toes. ? Keep your lower legs upright so they are parallel with the table legs. ? Do not let your hips go lower than your knees. ? Do not bend lower than told by your health care provider. ? If your knee pain increases, do not bend as low. 3. Hold the squat position for __________ seconds. 4. Slowly push with your legs to return to standing. Do not use your hands to pull yourself to standing. Repeat __________ times. Complete this exercise __________ times a day. Exercise J: Wall Slides (Quadriceps)  1. Lean your back against a smooth wall or door while   you walk your feet out 18-24 inches (46-61 cm) from it. 2. Place your feet hip-width apart. 3. Slowly slide down the wall or door until your knees bend __________ degrees. Keep your knees over your heels, not over your toes. Keep your knees in line with your hips. 4. Hold for __________ seconds. Repeat __________ times. Complete this exercise __________ times a day. Exercise K: Straight Leg Raises - Hip Abductors 1. Lie on your side with your left / right leg in the top position. Lie so your head, shoulder, knee, and hip line up. You may bend your bottom knee to help you keep your balance. 2. Roll your hips slightly forward so your hips are stacked directly over each other and your left / right knee is facing forward. 3. Leading with your heel, lift your top leg 4-6 inches (10-15 cm). You should feel the muscles in your outer hip lifting. ? Do not let your foot drift forward. ? Do not let your knee roll toward the ceiling. 4. Hold this position for __________ seconds. 5. Slowly return your leg to the starting  position. 6. Let your muscles relax completely after each repetition. Repeat __________ times. Complete this exercise __________ times a day. Exercise L: Straight Leg Raises - Hip Extensors 1. Lie on your abdomen on a firm surface. You can put a pillow under your hips if that is more comfortable. 2. Tense the muscles in your buttocks and lift your left / right leg about 4-6 inches (10-15 cm). Keep your knee straight as you lift your leg. 3. Hold this position for __________ seconds. 4. Slowly lower your leg to the starting position. 5. Let your leg relax completely after each repetition. Repeat __________ times. Complete this exercise __________ times a day. This information is not intended to replace advice given to you by your health care provider. Make sure you discuss any questions you have with your health care provider. Document Released: 09/03/2005 Document Revised: 07/14/2016 Document Reviewed: 08/26/2015 Elsevier Interactive Patient Education  2018 [employer]  Hand Exercises Hand exercises can be helpful to almost anyone. These exercises can strengthen the hands, improve flexibility and movement, and increase blood flow to the hands. These results can make work and daily tasks easier. Hand exercises can be especially helpful for people who have joint pain from arthritis or have nerve damage from overuse (carpal tunnel syndrome). These exercises can also help people who have injured a hand. Most of these hand exercises are fairly gentle stretching routines. You can do them often throughout the day. Still, it is a good idea to ask your health care provider which exercises would be best for you. Warming your hands before exercise may help to reduce stiffness. You can do this with gentle massage or by placing your hands in warm water for 15 minutes. Also, make sure you pay attention to your level of hand pain as you begin an exercise routine. Exercises Knuckle Bend Repeat this exercise  5-10 times with each hand. 6. Stand or sit with your arm, hand, and all five fingers pointed straight up. Make sure your wrist is straight. 7. Gently and slowly bend your fingers down and inward until the tips of your fingers are touching the tops of your palm. 8. Hold this position for a few seconds. 9. Extend your fingers out to their original position, all pointing straight up again.  Finger Fan Repeat this exercise 5-10 times with each hand. 5. Hold your arm and hand out in   front of you. Keep your wrist straight. 6. Squeeze your hand into a fist. 7. Hold this position for a few seconds. 8. Fan out, or spread apart, your hand and fingers as much as possible, stretching every joint fully.  Tabletop Repeat this exercise 5-10 times with each hand. 5. Stand or sit with your arm, hand, and all five fingers pointed straight up. Make sure your wrist is straight. 6. Gently and slowly bend your fingers at the knuckles where they meet the hand until your hand is making an upside-down L shape. Your fingers should form a tabletop. 7. Hold this position for a few seconds. 8. Extend your fingers out to their original position, all pointing straight up again.  Making Os Repeat this exercise 5-10 times with each hand. 1. Stand or sit with your arm, hand, and all five fingers pointed straight up. Make sure your wrist is straight. 2. Make an O shape by touching your pointer finger to your thumb. Hold for a few seconds. Then open your hand wide. 3. Repeat this motion with each finger on your hand.  Table Spread Repeat this exercise 5-10 times with each hand. 5. Place your hand on a table with your palm facing down. Make sure your wrist is straight. 6. Spread your fingers out as much as possible. Hold this position for a few seconds. 7. Slide your fingers back together again. Hold for a few seconds.  Ball Grip  Repeat this exercise 10-15 times with each hand. 7. Hold a tennis ball or another soft  ball in your hand. 8. While slowly increasing pressure, squeeze the ball as hard as possible. 9. Squeeze as hard as you can for 3-5 seconds. 10. Relax and repeat.  Wrist Curls Repeat this exercise 10-15 times with each hand. 6. Sit in a chair that has armrests. 7. Hold a light weight in your hand, such as a dumbbell that weighs 1-3 pounds (0.5-1.4 kg). Ask your health care provider what weight would be best for you. 8. Rest your hand just over the end of the chair arm with your palm facing up. 9. Gently pivot your wrist up and down while holding the weight. Do not twist your wrist from side to side.  Contact a health care provider if:  Your hand pain or discomfort gets much worse when you do an exercise.  Your hand pain or discomfort does not improve within 2 hours after you exercise. If you have any of these problems, stop doing these exercises right away. Do not do them again unless your health care provider says that you can. Get help right away if:  You develop sudden, severe hand pain. If this happens, stop doing these exercises right away. Do not do them again unless your health care provider says that you can. This information is not intended to replace advice given to you by your health care provider. Make sure you discuss any questions you have with your health care provider. Document Released: 10/01/2015 Document Revised: 03/27/2016 Document Reviewed: 04/30/2015 Elsevier Interactive Patient Education  2018 [employer] Inc.  

## 2017-07-09 ENCOUNTER — Ambulatory Visit (HOSPITAL_COMMUNITY)
Admission: EM | Admit: 2017-07-09 | Discharge: 2017-07-09 | Disposition: A | Discharge status: Home / Self Care | Payer: BLUE CROSS/BLUE SHIELD | Attending: Internal Medicine | Admitting: Internal Medicine

## 2017-07-09 ENCOUNTER — Encounter (HOSPITAL_COMMUNITY): Payer: Self-pay | Admitting: Family Medicine

## 2017-07-09 DIAGNOSIS — S0181XA Laceration without foreign body of other part of head, initial encounter: Secondary | ICD-10-CM

## 2017-07-09 DIAGNOSIS — W2209XA Striking against other stationary object, initial encounter: Secondary | ICD-10-CM

## 2017-07-09 NOTE — ED Triage Notes (Signed)
Pt here for laceration to forehead. sts hit her head on a car door. Denies LOC or dizziness.

## 2017-07-09 NOTE — Discharge Instructions (Signed)
Keep your wound clean and dry, do not remove the steristrips for one week. Monitor for signs of infection, if you have signs of infection, return to clinic as needed for reevaluation.

## 2017-07-09 NOTE — ED Provider Notes (Signed)
[facility]   [id number] 07/09/17 Arrival Time: 1523   SUBJECTIVE:  Joellyn RuedVallerie L Regino is a 41 y.o. female who presents to the urgent care with complaint of laceration to her forehead. She states that she "bumped her head" against a car door. She did not pass out, had no loss of consciousness, no blurred vision, no headache, dizziness, or other symptoms. She states she cleaned the wound at home, with Betadine, and applied a dressing.     Past Medical History:  Diagnosis Date  . Anemia   . Asthma   . Bipolar depression (HCC)   . Headache(784.0)   . Uveitis    Family History  Problem Relation Age of Onset  . Other Neg Hx    Social History   Social History  . Marital status: Single    Spouse name: N/A  . Number of children: N/A  . Years of education: N/A   Occupational History  . Not on file.   Social History Main Topics  . Smoking status: Current Every Day Smoker    Packs/day: 0.50    Types: Cigarettes  . Smokeless tobacco: Never Used  . Alcohol use No     Comment: occ  . Drug use: No  . Sexual activity: Yes    Birth control/ protection: None   Other Topics Concern  . Not on file   Social History Narrative  . No narrative on file   No outpatient prescriptions have been marked as taking for the 07/09/17 encounter [facility](Hospital Encounter).   Allergies  Allergen Reactions  . Namenda [Memantine Hcl] Other (See Comments)    Loss of vision, causes very bad bleeding/period  . Penicillins Anaphylaxis  . Sulfa Antibiotics Anaphylaxis  . Tizanidine Anaphylaxis  . Tramadol Anaphylaxis      ROS: As per HPI, remainder of ROS negative.   OBJECTIVE:   Vitals:   07/09/17 1557  BP: 105/60  Pulse: 65  Resp: 18  Temp: 98 F (36.7 C)  SpO2: 100%     General appearance: alert; no distress Eyes: PERRL; EOMI; conjunctiva normal HENT: normocephalicApproximately one and a half centimeter laceration on the forehead above the right eye extending through  the dermis. There is minimal bleeding, no evidence of underlying damage Neck: supple, no step-offs, deformities, point tenderness, no pain with range of motion Extremities: No edema; symmetrical with no gross deformities Skin: warm and dry Neurologic: normal gait; grossly normal Psychological: alert and cooperative; normal mood and affect      Labs:   Labs Reviewed - No data to display  No results found.     ASSESSMENT & PLAN:  1. Laceration of forehead, initial encounter    Tetanus vaccine was updated in April of this year. Wound was cleaned, consent was obtained after discussion of possible treatment plans, and closed with Steri-Strips. She tolerated the procedure well, advised to return if there are any signs or symptoms of infection.  Reviewed expectations re: course of current medical issues. Questions answered. Outlined signs and symptoms indicating need for more acute intervention. Patient verbalized understanding. After Visit Summary given.    Procedures:   Facial laceration:  Discussion of various options was done with the patient, patient consented to closing the wound with Steri-Strips. Wound was cleansed with sure cleanse and flushed with normal saline. No evidence of foreign body, or underlying injury. Wound edges were approximated, for Steri-Strips were placed along the injury. Patient tolerated the procedure well.     Dorena BodoKennard, Kaylum Shrum, NP 07/09/17  1719  

## 2017-08-23 DIAGNOSIS — M19042 Primary osteoarthritis, left hand: Secondary | ICD-10-CM

## 2017-08-23 DIAGNOSIS — M19041 Primary osteoarthritis, right hand: Secondary | ICD-10-CM | POA: Insufficient documentation

## 2017-08-23 NOTE — Progress Notes (Deleted)
   Office Visit Note  Patient: Patricia Arnold             Date of Birth: 13-Dec-1975           MRN: [id number]             PCP: Lucillie Garfinkelaheri, Arash, MD Referring: Lucillie Garfinkelaheri, Arash, MD Visit Date: 09/02/2017 Occupation: @GUAROCC @    Subjective:  No chief complaint on file.   History of Present Illness: Patricia RuedVallerie L Persaud is a 41 y.o. female ***   Activities of Daily Living:  Patient reports morning stiffness for *** {minute/hour:19697}.   Patient {ACTIONS;DENIES/REPORTS:21021675::"Denies"} nocturnal pain.  Difficulty dressing/grooming: {ACTIONS;DENIES/REPORTS:21021675::"Denies"} Difficulty climbing stairs: {ACTIONS;DENIES/REPORTS:21021675::"Denies"} Difficulty getting out of chair: {ACTIONS;DENIES/REPORTS:21021675::"Denies"} Difficulty using hands for taps, buttons, cutlery, and/or writing: {ACTIONS;DENIES/REPORTS:21021675::"Denies"}   No Rheumatology ROS completed.   PMFS History:  Patient Active Problem List   Diagnosis Date Noted  . Primary osteoarthritis of both feet 04/29/2017  . Avascular necrosis left knee 03/12/2017  . Polyarthralgia 02/18/2017  . Uveitis 02/18/2017  . Status post reconstruction of ligament of knee joint 02/18/2017  . Sickle cell trait (HCC) 02/18/2017  . History of asthma 02/18/2017  . History of migraine 02/18/2017  . Smoker 02/18/2017  . Vitamin D deficiency 02/18/2017  . Primary osteoarthritis of both knees 02/18/2017  . Adjustment disorder with depressed mood 08/11/2014  . Nausea/vomiting in pregnancy 06/21/2012  . Bacterial vaginosis 06/21/2012  . Viral gastroenteritis 06/21/2012    Past Medical History:  Diagnosis Date  . Anemia   . Asthma   . Bipolar depression (HCC)   . Headache(784.0)   . Uveitis     Family History  Problem Relation Age of Onset  . Other Neg Hx    Past Surgical History:  Procedure Laterality Date  . ELBOW SURGERY    . reconstrictive     Social History   Social History Narrative  . No narrative on file      Objective: Vital Signs: There were no vitals taken for this visit.   Physical Exam   Musculoskeletal Exam: ***  CDAI Exam: No CDAI exam completed.    Investigation: No additional findings.   Imaging: No results found.  Speciality Comments: No specialty comments available.    Procedures:  No procedures performed Allergies: Namenda [memantine hcl]; Penicillins; Sulfa antibiotics; Tizanidine; and Tramadol   Assessment / Plan:     Visit Diagnoses: No diagnosis found.    Orders: No orders of the defined types were placed in this encounter.  No orders of the defined types were placed in this encounter.   Face-to-face time spent with patient was *** minutes. 50% of time was spent in counseling and coordination of care.  Follow-Up Instructions: No Follow-up on file.   Ellen HenriMarissa C Lia Vigilante, NT  Note - This record has been created using [employer]agon software.  Chart creation errors have been sought, but may not always  have been located. Such creation errors do not reflect on  the standard of medical care.

## 2017-09-02 ENCOUNTER — Ambulatory Visit: Payer: BLUE CROSS/BLUE SHIELD | Admitting: Rheumatology

## 2017-09-11 ENCOUNTER — Emergency Department (HOSPITAL_COMMUNITY)
Admission: EM | Admit: 2017-09-11 | Discharge: 2017-09-11 | Disposition: A | Discharge status: Home / Self Care | Payer: Self-pay | Attending: Emergency Medicine | Admitting: Emergency Medicine

## 2017-09-11 ENCOUNTER — Encounter (HOSPITAL_COMMUNITY): Payer: Self-pay | Admitting: Emergency Medicine

## 2017-09-11 ENCOUNTER — Emergency Department (HOSPITAL_COMMUNITY): Payer: Self-pay

## 2017-09-11 ENCOUNTER — Other Ambulatory Visit: Payer: Self-pay

## 2017-09-11 DIAGNOSIS — M79672 Pain in left foot: Secondary | ICD-10-CM | POA: Insufficient documentation

## 2017-09-11 DIAGNOSIS — J45909 Unspecified asthma, uncomplicated: Secondary | ICD-10-CM | POA: Insufficient documentation

## 2017-09-11 DIAGNOSIS — F4321 Adjustment disorder with depressed mood: Secondary | ICD-10-CM | POA: Insufficient documentation

## 2017-09-11 DIAGNOSIS — F17210 Nicotine dependence, cigarettes, uncomplicated: Secondary | ICD-10-CM | POA: Insufficient documentation

## 2017-09-11 DIAGNOSIS — M79671 Pain in right foot: Secondary | ICD-10-CM | POA: Insufficient documentation

## 2017-09-11 DIAGNOSIS — Z79899 Other long term (current) drug therapy: Secondary | ICD-10-CM | POA: Insufficient documentation

## 2017-09-11 DIAGNOSIS — F319 Bipolar disorder, unspecified: Secondary | ICD-10-CM | POA: Insufficient documentation

## 2017-09-11 MED ORDER — PREDNISONE 20 MG PO TABS: 20.0000 mg | ORAL_TABLET | Freq: Every day | ORAL | 0 refills | Status: AC

## 2017-09-11 MED ORDER — GABAPENTIN 100 MG PO CAPS: 100.0000 mg | ORAL_CAPSULE | Freq: Three times a day (TID) | ORAL | 0 refills | Status: DC

## 2017-09-11 NOTE — ED Provider Notes (Signed)
[facility], [place] EMERGENCY DEPARTMENT Provider Note   CSN: [id number] Arrival date & time: 09/11/17  1210     History   Chief Complaint Chief Complaint  Patient presents with  . Foot Pain    HPI Patricia Arnold is a 41 y.o. female who presents with bilateral heel pain. Patient reports that she has been experiencing similar pain since February 2018.  She has been seen by her rheumatologist who diagnosed her with plantar fasciitis.  Patient reports that over the last 3 days, the heel pain has become worse.  Patient reports that last night, her heels were purple, causing concern.  Denies any redness.  Patient reports the pain is on the bottom of the foot and radiates to the top.  She describes it as a "sharp pain that shoots down her foot "  She denies any trauma or injury.  She reports that she is able to ambulate but reports worsening pain with ambulation.  Particularly when she tries to step on her heel.  Patient denies any back pain, neck pain, saddle anesthesia, urinary incontinence, bowel incontinence, fevers, redness or swelling of the legs.  The history is provided by the patient.    Past Medical History:  Diagnosis Date  . Anemia   . Asthma   . Bipolar depression (HCC)   . Headache(784.0)   . Uveitis     Patient Active Problem List   Diagnosis Date Noted  . Primary osteoarthritis of both hands 08/23/2017  . Primary osteoarthritis of both feet 04/29/2017  . Avascular necrosis left knee 03/12/2017  . Polyarthralgia 02/18/2017  . Uveitis 02/18/2017  . Status post reconstruction of ligament of knee joint 02/18/2017  . Sickle cell trait (HCC) 02/18/2017  . History of asthma 02/18/2017  . History of migraine 02/18/2017  . Smoker 02/18/2017  . Vitamin D deficiency 02/18/2017  . Primary osteoarthritis of both knees 02/18/2017  . Adjustment disorder with depressed mood 08/11/2014  . Nausea/vomiting in pregnancy 06/21/2012  . Bacterial vaginosis 06/21/2012    . Viral gastroenteritis 06/21/2012    Past Surgical History:  Procedure Laterality Date  . ELBOW SURGERY    . reconstrictive      OB History    Gravida Para Term Preterm AB Living   4 1 1  0 1 1   SAB TAB Ectopic Multiple Live Births   1 0 0 0         Home Medications    Prior to Admission medications   Medication Sig Start Date End Date Taking? Authorizing Provider  gabapentin (NEURONTIN) 100 MG capsule Take 1 capsule (100 mg total) 3 (three) times daily for 10 days by mouth. 09/11/17 09/21/17  Maxwell Caul, PA-C  predniSONE (DELTASONE) 20 MG tablet Take 1 tablet (20 mg total) daily for 6 days by mouth. 09/11/17 09/17/17  Maxwell Caul, PA-C  Vitamin D, Ergocalciferol, (DRISDOL) 50000 units CAPS capsule Take 1 capsule (50,000 Units total) by mouth every 7 (seven) days. 03/16/17   Pollyann Savoy, MD    Family History Family History  Problem Relation Age of Onset  . Other Neg Hx     Social History Social History   Tobacco Use  . Smoking status: Current Every Day Smoker    Packs/day: 0.50    Types: Cigarettes  . Smokeless tobacco: Never Used  Substance Use Topics  . Alcohol use: No    Comment: occ  . Drug use: No     Allergies   Namenda [memantine  hcl]; Penicillins; Sulfa antibiotics; Tizanidine; and Tramadol   Review of Systems Review of Systems  Constitutional: Negative for fever.  Gastrointestinal: Negative for vomiting.  Musculoskeletal: Negative for back pain and neck pain.       Bilateral foot pain  Skin: Negative for rash.  Neurological: Negative for weakness.     Physical Exam Updated Vital Signs BP 111/69   Pulse 66   Temp 98.8 F (37.1 C) (Oral)   Resp 18   LMP 08/27/2017   SpO2 98%   Physical Exam  Constitutional: She is oriented to person, place, and time. She appears well-developed and well-nourished.  Sitting comfortably on examination table  HENT:  Head: Normocephalic and atraumatic.  Mouth/Throat: Oropharynx is  clear and moist and mucous membranes are normal.  Eyes: Conjunctivae, EOM and lids are normal. Pupils are equal, round, and reactive to light.  Neck: Full passive range of motion without pain.  Cardiovascular:  Pulses:      Dorsalis pedis pulses are 2+ on the right side, and 2+ on the left side.  Pulmonary/Chest: Effort normal.  Abdominal: Soft. Normal appearance. There is no tenderness. There is no rigidity and no guarding.  Musculoskeletal: Normal range of motion.  Bilateral lower extremities are symmetric in appearance.  Tenderness palpation to the posterior aspect of the bilateral heels.  No deformity or crepitus noted. No Overlying soft tissue swelling, ecchymosis, erythema, warmth.  Mild tenderness palpation to the plantar fascia of the bilateral feet.  Dorsiflexion and plantar flexion of bilateral feet intact without difficulty.  No overlying warmth or erythema noted.  Tenderness palpation bilateral ankles, distal tib-fib, knees, bilateral hips.   Neurological: She is alert and oriented to person, place, and time. GCS eye subscore is 4. GCS verbal subscore is 5. GCS motor subscore is 6.  Follows commands, Moves all extremities  5/5 strength to BUE and BLE  Sensation intact throughout all major nerve distributions Normal gait  Skin: Skin is warm and dry. Capillary refill takes less than 2 seconds.  BLE are not dusky in appearance or cool to touch.  There is some scattered areas of hyperpigmentation noted to the posterior heels.  No evidence of ecchymosis, blue or gray discoloration.  Psychiatric: She has a normal mood and affect. Her speech is normal.  Nursing note and vitals reviewed.    ED Treatments / Results  Labs (all labs ordered are listed, but only abnormal results are displayed) Labs Reviewed - No data to display  EKG  EKG Interpretation None       Radiology Dg Foot Complete Left  Result Date: 09/11/2017 CLINICAL DATA:  Heel pain x9 months. EXAM: LEFT FOOT -  COMPLETE 3+ VIEW COMPARISON:  None. FINDINGS: There is no evidence of fracture or dislocation. There is no evidence of arthropathy or other focal bone abnormality. Soft tissues are unremarkable. IMPRESSION: Negative. Electronically Signed   By: Tollie Ethavid  Kwon M.D.   On: 09/11/2017 15:14   Dg Foot Complete Right  Result Date: 09/11/2017 CLINICAL DATA:  Heel pain x9 months. EXAM: RIGHT FOOT COMPLETE - 3+ VIEW COMPARISON:  None. FINDINGS: There is no evidence of fracture or dislocation. There is no evidence of arthropathy or other focal bone abnormality. Soft tissues are unremarkable. IMPRESSION: Negative. Electronically Signed   By: Tollie Ethavid  Kwon M.D.   On: 09/11/2017 15:13    Procedures Procedures (including critical care time)  Medications Ordered in ED Medications - No data to display   Initial Impression / Assessment and Plan /  ED Course  I have reviewed the triage vital signs and the nursing notes.  Pertinent labs & imaging results that were available during my care of the patient were reviewed by me and considered in my medical decision making (see chart for details).     41 year old female who comes in today with bilateral heel pain.  This is been an ongoing issue since February 2018.  Worse in the last few days.  Patient noticed some discoloration to the heels last night, prompting concern.  Has been able to ambulate but worsening pain with ambulation. Patient is afebrile, non-toxic appearing, sitting comfortably on examination table. Vital signs reviewed and stable. Patient is neurovascularly intact.  No neuro deficits noted on exam. Physical exam shows tenderness palpation to the posterior aspect of the bilateral heals.  No overlying deformity or crepitus.  No overlying warmth or erythema noted.  There is some hyperpigmentation noted to the bilateral heels but no obvious ecchymosis, blue/gray discoloration.  Patient reports that she got worried last night when she said that her heels were  "purple."  I see no evidence of peripheral discoloration noted to the heels bilaterally.  Achilles tendons appear to be intact bilaterally.  History/physical exam and are not concerning for shingles, DVT, septic arthritis, acute arterial embolism.  Consider plantar fasciitis versus tendinitis versus tarsal tunnel syndrome.  Low suspicion for acute fracture or dislocation.  Discussed with patient that she will likely need follow-up with her rheumatologist or podiatrist after today's evaluation.  Offered to obtain x-ray for evaluation of any acute abnormalities.  Patient agreeable to plan.  Offered patient analgesics in the department but she declined at this time.  X-rays reviewed.  Negative for any acute fracture or dislocation.  I compared x-rays to previous imaging done in April 2018.  No significant changes.  Discussed with patient.  We will plan to treat this is a mixed possible tendinitis/tarsal tunnel syndrome.  Wiill plan to give prednisone for any inflammation.  We will also start patient on gabapentin for nerve pain.  She has been on these medications previously and has no issue with him.  We will plan to give outpatient podiatry referral for patient to follow-up with.  Encourage patient to follow-up with her rheumatologist as directed. Patient had ample opportunity for questions and discussion. All patient's questions were answered with full understanding. Strict return precautions discussed. Patient expresses understanding and agreement to plan.     Final Clinical Impressions(s) / ED Diagnoses   Final diagnoses:  Bilateral foot pain    ED Discharge Orders        Ordered    gabapentin (NEURONTIN) 100 MG capsule  3 times daily     09/11/17 1559    predniSONE (DELTASONE) 20 MG tablet  Daily     09/11/17 1559       Maxwell CaulLayden, Lindsey A, PA-C 09/11/17 1801    Pricilla LovelessGoldston, Scott, MD 09/12/17 1955

## 2017-09-11 NOTE — ED Triage Notes (Signed)
Pt states she has had bilaterally heel pain since February. Pt states she looked at her heels yesterday and they appeared purple to her. Pt does has mild discoloration to bilateral heels but no swelling, no redness. Pt describing a burning pain in heels and in arch of foot.

## 2017-09-11 NOTE — Discharge Instructions (Signed)
You can take Tylenol or Ibuprofen as directed for pain. You can alternate Tylenol and Ibuprofen every 4 hours. If you take Tylenol at 1pm, then you can take Ibuprofen at 5pm. Then you can take Tylenol again at 9pm.   Take prednisone as directed.  Take gabapentin as directed.  As we discussed, follow-up with referred podiatrist for further evaluation.  Also follow-up with your rheumatologist for further evaluation.  Return the emergency department for any nemesis weakness of the legs, inability to walk, any redness, swelling of the feet, any blue or gray discoloration noted to the feet or any other worsening or concerning symptoms.

## 2017-09-26 ENCOUNTER — Encounter: Payer: Self-pay | Admitting: Rheumatology

## 2017-09-29 NOTE — Telephone Encounter (Signed)
Please schedule an appointment with Dr. Lajoyce Cornersuda and notify patient

## 2018-02-26 ENCOUNTER — Telehealth (INDEPENDENT_AMBULATORY_CARE_PROVIDER_SITE_OTHER): Payer: Self-pay

## 2018-02-26 NOTE — Telephone Encounter (Signed)
Called and left VM advising patient that I was following up with her for an appt.to see Dr. Lajoyce Cornersuda concerning her feet.  Advised patient to return my call.

## 2018-03-08 ENCOUNTER — Ambulatory Visit
Admission: RE | Admit: 2018-03-08 | Discharge: 2018-03-08 | Disposition: A | Discharge status: Home / Self Care | Payer: No Typology Code available for payment source | Source: Ambulatory Visit | Attending: Physical Medicine and Rehabilitation | Admitting: Physical Medicine and Rehabilitation

## 2018-03-08 ENCOUNTER — Other Ambulatory Visit: Payer: Self-pay | Admitting: Physical Medicine and Rehabilitation

## 2018-03-08 DIAGNOSIS — Z0289 Encounter for other administrative examinations: Secondary | ICD-10-CM

## 2018-09-24 ENCOUNTER — Ambulatory Visit (HOSPITAL_COMMUNITY)
Admission: RE | Admit: 2018-09-24 | Discharge: 2018-09-24 | Disposition: A | Discharge status: Home / Self Care | Payer: 59 | Attending: Psychiatry | Admitting: Psychiatry

## 2018-09-24 DIAGNOSIS — F411 Generalized anxiety disorder: Secondary | ICD-10-CM | POA: Diagnosis not present

## 2018-09-24 DIAGNOSIS — F17210 Nicotine dependence, cigarettes, uncomplicated: Secondary | ICD-10-CM | POA: Insufficient documentation

## 2018-09-24 DIAGNOSIS — F319 Bipolar disorder, unspecified: Secondary | ICD-10-CM | POA: Diagnosis not present
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
## 2018-09-24 NOTE — H&P (Signed)
Behavioral Health Medical Screening Exam  Patricia Arnold is an 42 y.o. female.  Total Time spent with patient: 20 minutes  Psychiatric Specialty Exam: Physical Exam  Nursing note and vitals reviewed. Constitutional: She is oriented to person, place, and time. She appears well-developed and well-nourished.  Cardiovascular: Normal rate.  Respiratory: Effort normal.  Musculoskeletal: Normal range of motion.  Neurological: She is alert and oriented to person, place, and time.  Skin: Skin is warm.    Review of Systems  Constitutional: Negative.   HENT: Negative.   Eyes: Negative.   Respiratory: Negative.   Cardiovascular: Negative.   Gastrointestinal: Positive for heartburn.  Genitourinary: Negative.   Musculoskeletal: Negative.   Skin: Negative.   Neurological: Negative.   Endo/Heme/Allergies: Negative.   Psychiatric/Behavioral: Positive for depression. Negative for hallucinations, substance abuse and suicidal ideas. The patient is nervous/anxious.     Blood pressure (!) 137/93, pulse 89, temperature 98.7 F (37.1 C), resp. rate 18, SpO2 100 %, unknown if currently breastfeeding.There is no height or weight on file to calculate BMI.  General Appearance: Casual and Fairly Groomed  Eye Contact:  Good  Speech:  Clear and Coherent and Normal Rate  Volume:  Normal  Mood:  Euthymic  Affect:  Congruent  Thought Process:  Coherent and Descriptions of Associations: Intact  Orientation:  Full (Time, Place, and Person)  Thought Content:  WDL  Suicidal Thoughts:  No  Homicidal Thoughts:  No  Memory:  Immediate;   Good Recent;   Good Remote;   Good  Judgement:  Good  Insight:  Fair  Psychomotor Activity:  Normal  Concentration: Concentration: Good and Attention Span: Good  Recall:  Good  Fund of Knowledge:Good  Language: Good  Akathisia:  No  Handed:  Right  AIMS (if indicated):     Assets:  Communication Skills Desire for Improvement Financial  Resources/Insurance Housing Physical Health Resilience Social Support Transportation  Sleep:       Musculoskeletal: Strength & Muscle Tone: within normal limits Gait & Station: normal Patient leans: N/A  Blood pressure (!) 137/93, pulse 89, temperature 98.7 F (37.1 C), resp. rate 18, SpO2 100 %, unknown if currently breastfeeding.  Recommendations:  Based on my evaluation the patient does not appear to have an emergency medical condition.  Gerlene Burdockravis B Margeart Allender, FNP 09/24/2018, 5:27 PM

## 2018-09-27 ENCOUNTER — Other Ambulatory Visit: Payer: Self-pay | Admitting: Family Medicine

## 2018-09-27 DIAGNOSIS — Z1231 Encounter for screening mammogram for malignant neoplasm of breast: Secondary | ICD-10-CM

## 2018-09-28 ENCOUNTER — Other Ambulatory Visit (HOSPITAL_COMMUNITY): Payer: 59 | Attending: Psychiatry | Admitting: Family

## 2018-09-28 ENCOUNTER — Encounter (HOSPITAL_COMMUNITY): Payer: Self-pay | Admitting: Family

## 2018-09-28 DIAGNOSIS — F330 Major depressive disorder, recurrent, mild: Secondary | ICD-10-CM

## 2018-09-28 DIAGNOSIS — F39 Unspecified mood [affective] disorder: Secondary | ICD-10-CM | POA: Insufficient documentation

## 2018-09-28 NOTE — Progress Notes (Unsigned)
Psychiatric Initial Adult Assessment   Patient Identification: Patricia Arnold MRN:  [id number] Date of Evaluation:  09/28/2018 Referral Source: [facility] assessment department  Chief Complaint:  Depression Visit Diagnosis: No diagnosis found.  History of Present Illness: Patricia Arnold is a 42 year old African-American female presents with worsening depression and anxiety related to stresses at work.  Reports she is employed by [employer].  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 Patient report a  disagreement between she and another coworker. States she feels as if  her immediate supervisor is always taken the other coworker side of the disagreement/discussion.  Patient reports she attempted to file numerous complaints however without any resolution.  Patient reports she was asked to follow-up with appointment assistance program EAP who advised her to come in for mental health assessment. Raya expressed mild paranoia she states her coworkers are out to get her due to her expressing her concerns with the company.  Does states " I dont want to hurt myself but I will hurt someone else if I am provoked"  Patricia Arnold currently denies that she is followed by psychiatrist or therapist.  Denies that she is suicidal or homicidal.  Denies auditory or visual hallucinations.  Denies taking or that she is prescribed  medications for mood disorders or mood irritability.  Patient reports previous inpatient admission a few years ago related to worsening stressors between she and her child's father.    Reports a family history of mental illness.  Reports her mother was diagnosed: Depression and Bipolar disorder.  Patient has declined initiating any medications at this time.  Patient to start intensive  outpatient programming 09/28/2018.   Associated Signs/Symptoms: Depression Symptoms:  depressed mood, difficulty concentrating, anxiety, (Hypo) Manic Symptoms:  Distractibility, Irritable Mood, Labiality of Mood, Anxiety Symptoms:  Excessive Worry, Psychotic Symptoms:  Hallucinations: None PTSD Symptoms: Avoidance:  Decreased Interest/Participation  Past Psychiatric History:  Previous Psychotropic Medications: No   Substance Abuse History in the last 12 months:  No.  Consequences of Substance Abuse: NA  Past Medical History:  Past Medical History:  Diagnosis Date  . Anemia   . Asthma   . Bipolar depression (HCC)   . Headache(784.0)   . Uveitis     Past Surgical History:  Procedure Laterality Date  . ELBOW SURGERY    . reconstrictive      Family Psychiatric History: Sister: depression Mother: bipolar  Family History:  Family History  Problem Relation Age of Onset  . Other Neg Hx     Social History:   Social History   Socioeconomic History  . Marital status: Single    Spouse name: Not on file  . Number of children: Not on file  . Years of education: Not on file  . Highest education level: Not on file  Occupational History  . Not on file  Social Needs  . Financial resource strain: Not on file  . Food insecurity:    Worry: Not on file    Inability: Not on file  . Transportation needs:    Medical: Not on file    Non-medical: Not on file  Tobacco Use  . Smoking status: Current Every Day Smoker    Packs/day: 0.50    Types: Cigarettes  . Smokeless tobacco: Never Used  Substance and Sexual Activity  .  Alcohol use: No    Comment: occ  . Drug use: No  . Sexual activity: Yes    Birth control/protection: None  Lifestyle  . Physical activity:    Days per week: Not on file    Minutes per session: Not on file  . Stress: Not on file  Relationships  . Social connections:    Talks on phone: Not on file    Gets together: Not on file    Attends  religious service: Not on file    Active member of club or organization: Not on file    Attends meetings of clubs or organizations: Not on file    Relationship status: Not on file  Other Topics Concern  . Not on file  Social History Narrative  . Not on file    Additional Social History:   Allergies:   Allergies  Allergen Reactions  . Namenda [Memantine Hcl] Other (See Comments)    Loss of vision, causes very bad bleeding/period  . Penicillins Anaphylaxis  . Sulfa Antibiotics Anaphylaxis  . Tizanidine Anaphylaxis  . Tramadol Anaphylaxis    Metabolic Disorder Labs: No results found for: HGBA1C, MPG No results found for: PROLACTIN No results found for: CHOL, TRIG, HDL, CHOLHDL, VLDL, LDLCALC Lab Results  Component Value Date   TSH 0.63 02/18/2017    Therapeutic Level Labs: No results found for: LITHIUM No results found for: CBMZ No results found for: VALPROATE  Current Medications: Current Outpatient Medications  Medication Sig Dispense Refill  . norethindrone-ethinyl estradiol (BLISOVI FE 1/20) 1-20 MG-MCG tablet Take 1 tablet by mouth daily.     No current facility-administered medications for this visit.     Musculoskeletal: Strength & Muscle Tone: within normal limits Gait & Station: normal Patient leans: N/A  Psychiatric Specialty Exam: ROS  unknown if currently breastfeeding.There is no height or weight on file to calculate BMI.  General Appearance: Casual  Eye Contact:  Fair  Speech:  Clear and Coherent slightly pressured  Volume:  Normal  Mood:  Anxious and Depressed  Affect:  Congruent  Thought Process:  Coherent  Orientation:  Full (Time, Place, and Person)  Thought Content:  Hallucinations: None and Paranoid Ideation  Suicidal Thoughts:  No  Homicidal Thoughts:  No  Memory:  Immediate;   Fair Recent;   Fair Remote;   Fair  Judgement:  Fair  Insight:  Fair  Psychomotor Activity:  Normal  Concentration:  Concentration: Fair  Recall:  Eastman KodakFair   Fund of Knowledge:Fair  Language: Fair  Akathisia:  No  Handed:  Right  AIMS (if indicated):    Assets:  Communication Skills Desire for Improvement Resilience Social Support  ADL's:  Intact  Cognition: WNL  Sleep:  Fair   Screenings: GAD and PHQ9   Assessment and Plan:  Admitted to intensive Outpatient program -IOP Patient to consider starting Neurontin and or Tegretol for mood stabilization/irritability. -Patient reports she would like to start with group session (talk therapy) first.  Treatment plan was reviewed and agreed upon by NP T. Melvyn NethLewis and patient Maud DeedValerie Vanasten need for group services    Oneta Rackanika N Dallan Schonberg, NP 11/26/201911:05 AM

## 2018-09-28 NOTE — Progress Notes (Addendum)
Comprehensive Clinical Assessment (CCA) Note  09/28/2018 Patricia Arnold [id number]  Visit Diagnosis:      ICD-10-CM   1. Mild episode of recurrent major depressive disorder (HCC) F33.0   2. 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  (See scanned document in Chart Review)  CCA Part Two A  Intake/Chief Complaint:  CCA Intake With Chief Complaint CCA Part Two Date: 09/28/18 CCA Part Two Time: 3785 Chief Complaint/Presenting Problem: This is a 42 yr old single, employed, [place] American female who was referred per her EAP; treatment for worsening depressive and anxiety symptoms.  Admits to some paranoia.  Pt states she feels that certain coworkers are talking about her and out to "get" her.  Denies SI, HI or A/V hallucinations.  Pt states she's been feeling this way since August 17, 2018.  Triggers/Stressors:  1)  Job of eight months.  Pt is employed by [employer] as a Merchant navy officer.  According to pt, there is conflict between her, shift Nurse, children's.  "The shift leader shows favoritism and bully's me."  Pt states she asked to transfer, but management has blocked it.  "They claim it's due to staffing issues."  Pt reports there was a meeting with HR and the other two (shift leader Camera operator).  Pt states it didn't go very well.  "I could tell that the shift leader and manager had met prior to the meeting to get their stories in sync."  Pt states she left from the meeting and didn't return to work.  Pt admits to one prior psychiatric admission ~ 5-10 yrs ago when she was hospitalized at [facility] d/t depression with paranoia.  Family Hx:  Mother (Bipolar D/O).                                                                        Patients Currently Reported Symptoms/Problems: Anxiety, Sadness, broken sleep, irritable, ruminating thoughts, poor concentration, anhedonia, poor energy, no motivation, isolative, paranoia,  tearful Collateral Involvement: Pt states that her girlfriend and cousins are very supportive. Individual's Strengths: Pt states she is an outgoing, fun person. Individual's Abilities: Pt is able to articulate her feelings Type of Services Patient Feels Are Needed: MH-IOP  Mental Health Symptoms Depression:  Depression: Change in energy/activity, Difficulty Concentrating, Hopelessness, Increase/decrease in appetite, Irritability, Sleep (too much or little), Tearfulness  Mania:  Mania: N/A  Anxiety:   Anxiety: Worrying  Psychosis:  Psychosis: N/A  Trauma:  Trauma: N/A  Obsessions:  Obsessions: N/A  Compulsions:  Compulsions: N/A  Inattention:  Inattention: N/A  Hyperactivity/Impulsivity:  Hyperactivity/Impulsivity: N/A  Oppositional/Defiant Behaviors:  Oppositional/Defiant Behaviors: N/A  Borderline Personality:  Emotional Irregularity: N/A  Other Mood/Personality Symptoms:      Mental Status Exam Appearance and self-care  Stature:  Stature: Small  Weight:  Weight: Thin  Clothing:  Clothing: Casual  Grooming:  Grooming: Normal  Cosmetic use:  Cosmetic Use: None  Posture/gait:  Posture/Gait: Normal  Motor activity:  Motor Activity: Not Remarkable  Sensorium  Attention:  Attention: Normal  Concentration:  Concentration: Preoccupied  Orientation:  Orientation: X5  Recall/memory:  Recall/Memory: Normal  Affect and  Mood  Affect:  Affect: Blunted  Mood:  Mood: Irritable, Depressed, Anxious  Relating  Eye contact:  Eye Contact: Normal  Facial expression:  Facial Expression: Responsive, Tense  Attitude toward examiner:  Attitude Toward Examiner: Cooperative  Thought and Language  Speech flow: Speech Flow: Normal  Thought content:  Thought Content: Delusions, Personalizations  Preoccupation:  Preoccupations: Ruminations  Hallucinations:     Organization:     Transport planner of Knowledge:  Fund of Knowledge: Average  Intelligence:  Intelligence: Average  Abstraction:   Abstraction: Normal  Judgement:  Judgement: Fair  Art therapist:  Reality Testing: Distorted  Insight:  Insight: Gaps  Decision Making:  Decision Making: Impulsive  Social Functioning  Social Maturity:  Social Maturity: Isolates, Impulsive  Social Judgement:  Social Judgement: Victimized  Stress  Stressors:  Stressors: Work  Coping Ability:  Coping Ability: English as a second language teacher Deficits:     Supports:      Family and Psychosocial History: Family history Marital status: Single Are you sexually active?: No What is your sexual orientation?: heterosexual Does patient have children?: Yes How many children?: 1 How is patient's relationship with their children?: 41 yr old son is married and lives in [place], [place]  Childhood History:  Childhood History By whom was/is the patient raised?: Both parents Additional childhood history information: [place] and raised in [place], [place].  Parents argued alot.  Pt watched mother struggle with Bipolar D/O during her whole childhood.  "My mother was a helicopter parent., but I can remember her mood changing a lot.  I can remember her walking around nude outside and other things that were out of the norm."  Pt states that her father was very factual, thorough, but laid back.  Pt states she didn't have any problems in school.  Graduated early; top of her class.  Denies any abuse or trauma.                                         Description of patient's relationship with caregiver when they were a child: Was close to father.  States parents argued a lot. Does patient have siblings?: Yes Number of Siblings: 2 Description of patient's current relationship with siblings: Pt is estranged from both older sister and brother.  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  How long has patient been employed?: 8 months Patient's job has been impacted by current illness: Yes Describe how patient's job has been impacted: Difficulty functioning there What is the longest time patient has a held a job?: 14 yrs Where was the patient employed at that time?: [employer] Did You Receive Any Psychiatric Treatment/Services While in the [employer]?: No Are There Guns or Other Weapons in [place]?: No  Education: Education Did Teacher, adult education From [employer]?: Yes Did Physicist, medical?: Yes What Type of College Degree Do you Have?: Attended three yrs before getting pregnant  Did You Attend Graduate School?: No What Was Your Major?: Finance with minor in Business  Did You Have An Individualized Education Program (IIEP): No Did You Have Any Difficulty At School?: No  Religion: Religion/Spirituality Are You A Religious Person?: Yes What is Your Religious Affiliation?: Christian  Leisure/Recreation: Leisure / Recreation Leisure and Hobbies: Cooking, sports, travelling, walking  Exercise/Diet: Exercise/Diet Do You Exercise?: No Have You Gained or Lost A Significant Amount of Weight in the Past Six Months?: No Do You Follow a Special Diet?: No Do You Have Any Trouble Sleeping?: Yes Explanation of Sleeping Difficulties: Sleep is broken  CCA Part Two C  Alcohol/Drug Use: Alcohol / Drug Use Pain Medications: see MAR Prescriptions: see MAR Over the Counter: see MAR History of alcohol / drug use?: Yes Longest period of sobriety (when/how long): denies a hx of addiction Substance #1 Name of Substance  1: alcohol 1 - Age of First Use: not assessed 1 - Amount (size/oz): couple drinks 1 - Frequency: on occasion 1 - Duration: since onset 1 - Last Use / Amount: not assessed                    CCA Part Three  ASAM's:  Six Dimensions of Multidimensional Assessment  Dimension 1:  Acute Intoxication and/or Withdrawal Potential:     Dimension 2:  Biomedical Conditions and Complications:     Dimension 3:  Emotional, Behavioral, or Cognitive Conditions and Complications:     Dimension 4:  Readiness to Change:     Dimension 5:  Relapse, Continued use, or Continued Problem Potential:     Dimension 6:  Recovery/Living Environment:      Substance use Disorder (SUD)    Social Function:  Social Functioning Social Maturity: Isolates, Impulsive Social Judgement: Victimized  Stress:  Stress Stressors: Work Coping Ability: Overwhelmed Patient Takes Medications The Music therapist?: NA Priority Risk: Moderate Risk  Risk Assessment- Self-Harm Potential: Risk Assessment For Self-Harm Potential Thoughts of Self-Harm: No current thoughts Method: No plan Availability of Means: No access/NA  Risk Assessment -Dangerous to Others Potential: Risk Assessment For Dangerous to Others Potential Method: No Plan Availability of Means: No access or NA Intent: Vague intent or NA Notification Required: No need or identified person  DSM5 Diagnoses: Patient Active Problem List   Diagnosis Date Noted  . Primary osteoarthritis of both hands 08/23/2017  . Primary osteoarthritis of both feet 04/29/2017  . Avascular necrosis left knee 03/12/2017  . Polyarthralgia 02/18/2017  . Uveitis 02/18/2017  . Status post reconstruction of ligament of knee joint 02/18/2017  . Sickle cell trait ([facility]) 02/18/2017  . History of asthma 02/18/2017  . History of migraine 02/18/2017  . Smoker 02/18/2017  . Vitamin D deficiency 02/18/2017  . Primary osteoarthritis of both knees 02/18/2017  . Adjustment  disorder with depressed mood 08/11/2014  . Nausea/vomiting in pregnancy 06/21/2012  . Bacterial vaginosis 06/21/2012  . Viral gastroenteritis 06/21/2012    Patient Centered Plan: Patient is on the following Treatment Plan(s):  Anxiety and Depression  Recommendations for Services/Supports/Treatments: Recommendations for Services/Supports/Treatments Recommendations For Services/Supports/Treatments: IOP (Intensive Outpatient Program)  Treatment Plan Summary:  Oriented pt to MH-IOP.  Provided pt with an orientation folder.  Will refer pt to a therapist and psychiatrist.  Encouraged support groups.  Provided pt with Vocational Rehab information.  Referrals to Alternative Service(s): Referred to Alternative Service(s):   Place:   Date:   Time:    Referred to Alternative Service(s):   Place:  Date:   Time:    Referred to Alternative Service(s):   Place:   Date:   Time:    Referred to Alternative Service(s):   Place:   Date:   Time:     Ronna Herskowitz, RITA, M.Ed, CNA

## 2018-09-29 ENCOUNTER — Other Ambulatory Visit (HOSPITAL_COMMUNITY): Payer: 59 | Admitting: Licensed Clinical Social Worker

## 2018-09-29 DIAGNOSIS — F330 Major depressive disorder, recurrent, mild: Secondary | ICD-10-CM

## 2018-09-29 DIAGNOSIS — F39 Unspecified mood [affective] disorder: Secondary | ICD-10-CM | POA: Diagnosis not present

## 2018-09-29 NOTE — Progress Notes (Signed)
    Daily Group Progress Note  Program: IOP  Group Time: 9am-12pm  Participation Level: Active  Behavioral Response: Motivated  Type of Therapy:  Group Therapy; psycho-educational group, process group  Summary of Progress:  The purpose of this group is to utilize CBT and DBT skills in a group setting to increase use of healthy coping skills and decrease intensity of active mental health symptoms.  9am-10:30am Clinician presented 'Panic List For Distress Tolerance' and reviewed skills to decrease intensity of feelings in the moment. Clinician presented the topic of Cognitive distortions. Clinician and group members discussed how to identify types of distortions and challenge each style. Clinician praised clients on being able to create alternative thoughts. Clinician and group members processed how distortions effect how we experience and respond to situations.  10:30am-12pm Clinician presented the topic of Self-Validation. Clinician and group members processed the importance of having thoughts and feelings validated. Clinician and group members discussed gaslighting and effects of not feeling validated or supporting in the work environment. Clinician and group members discussed how to use mindfulness and radical acceptance to focus on self validation and responding rather than reacting to people or situations out of individual control. Clinician and group members focused on using I-statements and not negating positive self talk with 'but-statements.' Client verbalized frustration with perceived disrespect in the workplace. Client was able to identify and challenged several distorted thoughts, specifically 'should' statements. Client reports she would likely feel different if she felt heard rather than ignored.  Harlon DittyKarissa A Jordan Pardini, LCSW

## 2018-10-04 ENCOUNTER — Telehealth (HOSPITAL_COMMUNITY): Payer: Self-pay | Admitting: Psychiatry

## 2018-10-04 ENCOUNTER — Other Ambulatory Visit (HOSPITAL_COMMUNITY): Payer: 59 | Admitting: Psychiatry

## 2018-10-05 ENCOUNTER — Other Ambulatory Visit (HOSPITAL_COMMUNITY): Payer: 59 | Attending: Psychiatry | Admitting: Family

## 2018-10-05 DIAGNOSIS — Z882 Allergy status to sulfonamides status: Secondary | ICD-10-CM | POA: Diagnosis not present

## 2018-10-05 DIAGNOSIS — J45909 Unspecified asthma, uncomplicated: Secondary | ICD-10-CM | POA: Diagnosis not present

## 2018-10-05 DIAGNOSIS — Z888 Allergy status to other drugs, medicaments and biological substances status: Secondary | ICD-10-CM | POA: Insufficient documentation

## 2018-10-05 DIAGNOSIS — Z880 Allergy status to penicillin: Secondary | ICD-10-CM | POA: Diagnosis not present

## 2018-10-05 DIAGNOSIS — F17210 Nicotine dependence, cigarettes, uncomplicated: Secondary | ICD-10-CM | POA: Insufficient documentation

## 2018-10-05 DIAGNOSIS — F330 Major depressive disorder, recurrent, mild: Secondary | ICD-10-CM | POA: Diagnosis present

## 2018-10-05 DIAGNOSIS — F4321 Adjustment disorder with depressed mood: Secondary | ICD-10-CM

## 2018-10-05 DIAGNOSIS — Z818 Family history of other mental and behavioral disorders: Secondary | ICD-10-CM | POA: Insufficient documentation

## 2018-10-06 ENCOUNTER — Other Ambulatory Visit (HOSPITAL_COMMUNITY): Payer: 59 | Admitting: Family

## 2018-10-06 DIAGNOSIS — F330 Major depressive disorder, recurrent, mild: Secondary | ICD-10-CM

## 2018-10-06 NOTE — Progress Notes (Signed)
    Daily Group Progress Note  Program: IOP  Group Time: 9am-12pm  Participation Level: Active  Behavioral Response: Appropriate and Motivated  Type of Therapy:  Group Therapy; psycho-educational group, process group  Summary of Progress:  The purpose of this group is to utilize CBT and DBT skills in a group setting to increase knowledge and utilization of healthy coping skills and decrease intensity of active mental health symptoms.  9am-10:30am Clinician provided psycho-education on Mindfulness. Clinician and group members discussed components and benefits of practicing mindfulness. Clinician provided clients group activity where remaining mindful and in the moment was necessary. Clinician praised client participated and identifying thoughts which caused distractions. Clinician and group members discussed ways to add mindful moments into the day, such as mindful walking and eating. Clinician and group members practiced 5 senses grounding skill in session. Clinicians and group members discussed differences in rational, emotional, and wise mind. Clinician and group members discussed the importance of positive self talk and being mindful of when thoughts are causing uncomfortable feelings.  10:30am-12pm Clinician provided clients the opportunity to practice mindfulness using guided visualization. Clinician and group members discussed thoughts and feelings related to this activity. Clinician praised clients honesty in identifying specific thoughts and feelings. Clinician presented WHAT and HOW skills and discussed with clients. Clinician and group members processed some of the pros and cons of adding mindfulness into the day. Clinician and group members practiced identifying the differences in stating facts vs non-judgmental stances. Clinician and group members completed FIT activity focused on using mindfulness in once specific activity. Clients were able to create fidgets and add scents and were  encouraged to use these in their grounding activities. Client participated in all group discussions and activities. Client remains frustrated with disrespectful people however shared she was able to have a civil conversation with her mother using effective communication skills without losing her temper.   Harlon DittyKarissa A Asaph Serena, LCSW

## 2018-10-07 ENCOUNTER — Other Ambulatory Visit (HOSPITAL_COMMUNITY): Payer: 59 | Admitting: Licensed Clinical Social Worker

## 2018-10-07 DIAGNOSIS — F330 Major depressive disorder, recurrent, mild: Secondary | ICD-10-CM | POA: Diagnosis not present

## 2018-10-07 NOTE — Progress Notes (Signed)
    Daily Group Progress Note  Program: IOP  Group Time: 9am-12pm  Participation Level: Active  Behavioral Response: Appropriate, Sharing and Motivated  Type of Therapy:  Group Therapy; psycho-educational group, process group  Summary of Progress:  The purpose of this group is to utilize CBT and DBT skills in a group setting to increase utilization of healthy coping skills and decrease intensity of active mental health symptoms.  9am-11am Clinician and group members continued with the topic of Anger. Clinician and group member discussed cycle of anger, and how the cycle is similar for other overwhelming/ uncomfortable emotions. Clinician and group members discussed identifying known triggering events, automatic negative thoughts, and physical symptoms. Clinician requested clients identify body sensations at low, medium, and high intensity of feelings. Clinician reminded clients of CBT triangle and connection between thoughts, feelings, and behaviors. Clinician role played with client using I-statements in response to others who are not respectful of boundaries, which leads to anger. Clinician praised client engagement and validated the skill is not very easily implemented first time. Clinician provided clients with Anger Coping Skills (using diversions, taking a time-out, know your warning signs) including Emotional Regulation Skills Opposite Action and Check the Facts.  11am-12pm Grief and Loss processing group facilitated by Chaplin. Focus of discussion is on identifying types of grief/loss in life and responses to these losses. Chaplin provided some information on mindfulness which will be followed up on in next session.  Client was able to identify body sensations at different levels of uncomfortable emotions. Client demonstrated ability to create alternative thoughts after identifying feelings and distorted thoughts. Client was receptive to re-direction from clinician.  Harlon DittyKarissa A Doyne Ellinger,  LCSW

## 2018-10-07 NOTE — Progress Notes (Signed)
    Daily Group Progress Note  Program: IOP  Group Time: 9am-12pm  Participation Level: Active  Behavioral Response: Appropriate  Type of Therapy:  Group Therapy; process group, psycho-educational group  Summary of Progress:  The purpose of this encounter is to utilize CBT and DBT skills in a group setting to increase use of healthy coping skills and decrease intensity of active mental health symptoms.  9am-11am Clinician and group members completed and discussed "I Am" poem, focused on identifying priorities, goals, strengths, and areas of desired growth. Clinician actively listened and utilize socratic questioning while validating client feelings. Clinician encouraged clients to identify specific thoughts and feelings during conversations. Clinician facilitated discussion on the importance of utilizing previously learned I-statements and assertive communication to maintain positive, supportive relationships.  11am-12pm Yoga and Mindfulness session lead by co-facilitator. Client participated in group discussions and activities. Client participated less than usual due to ongoing migraine. Client provided appropriate feedback to group members and shared priorities related to her decision-making.   Harlon DittyKarissa A Lashara Urey, LCSW

## 2018-10-08 ENCOUNTER — Other Ambulatory Visit (HOSPITAL_COMMUNITY): Payer: 59 | Admitting: Psychiatry

## 2018-10-11 ENCOUNTER — Other Ambulatory Visit (HOSPITAL_COMMUNITY): Payer: 59

## 2018-10-12 ENCOUNTER — Other Ambulatory Visit (HOSPITAL_COMMUNITY): Payer: 59 | Admitting: Licensed Clinical Social Worker

## 2018-10-12 DIAGNOSIS — F330 Major depressive disorder, recurrent, mild: Secondary | ICD-10-CM

## 2018-10-12 NOTE — Progress Notes (Signed)
    Daily Group Progress Note  Program: IOP  Group Time: 9am-12pm  Participation Level: Minimal  Behavioral Response: Appropriate  Type of Therapy:  Group Therapy; psycho-educational group, process group  Summary of Progress:  The purpose of this group is to utilize CBT and DBT skills in a group setting to increase knowledge of and utilization of healthy coping skills to manager active mental health symptoms.  9am-11am Clinician presented the topic of 'Feelings, Thoughts, and Mind Traps' from [facility]. Clinician processed with group members different mind traps and role played how to challenge each type of thought. Clinician challenged group members to identify distorted thoughts, the feeling which came with the thought, and create an alternative thoughts. Clinician and group members reviewed steps for challenging mind traps, starting with identifying uncomfortable feeling and related thought and disrupting thoughts with positive self talk and alternative, helpful thoughts. Clinician praised clients on going open to new thoughts, even if they did not agree. Clinician presented 'Roadblocks to Healthy Thinking: Ways of Thinking That Keep You Stuck." Clinician challenged group members to identify a time where they were caught in these, or others were caught in them during an interaction. Clinician and group members reviewed 'Developing New Thinking Habits' in response to distorted thinking. Clinician validated that skill is difficult and does not come overnight.  11am-12pm Second group co-facilitated by [employer]. Processing related to grief and losses and coping with life changes. Client actively listened to discussions and participated in discussions. Client reports receiving several medical diagnosis recently and verbalized loss related to freedom of eating and having to change her relationship with food to address health concerns.  Harlon DittyKarissa A Brone, LCSW

## 2018-10-13 ENCOUNTER — Encounter (HOSPITAL_COMMUNITY): Payer: Self-pay | Admitting: Family

## 2018-10-13 ENCOUNTER — Other Ambulatory Visit (HOSPITAL_COMMUNITY): Payer: 59 | Admitting: Licensed Clinical Social Worker

## 2018-10-13 DIAGNOSIS — F330 Major depressive disorder, recurrent, mild: Secondary | ICD-10-CM

## 2018-10-13 NOTE — Progress Notes (Signed)
    Daily Group Progress Note  Program: IOP  Group Time: 9am-12pm  Participation Level: Active  Behavioral Response: Appropriate  Type of Therapy:  Group Therapy; psycho-educational group, process group  Summary of Progress:  Clinician checked in with group, assessing for SI/HI/psychosis.  9am-11am Clinician presented the topic of Vulnerability. Clinician provided Video by Dewain PenningBrene Brown. Clinician facilitated discussion with group members related to the topic of vulnerability. Clinician inquired about thoughts and feelings related to topic. Clinician and group members discussed pros of showing vulnerability and barriers to opening up. Clinician presented DBT Distress Tolerance Skills. Clinician and group members review Distraction skill with ACCEPTS, Self-Soothe with Senses, and Radical Acceptance. Clinician and group members practiced 5-4-3-2-1 mindfulness senses skill as well as square breathing.  Clinician and group members discussed the importance with the ACCEPT skill to be mindful of only using distraction skills to lessen intensity of emotion, not numb the emotion and return to isolating. Clinician and group members also reviewed TIPP skill.  11am-12pm Clinician facilitated a discussions on self esteem utilizing gratitude and positive self trait exercise Totika. Activity focused on using open-ended questions intended to promote discussions and processing about self-confidence, setting and achieving goals, role models, motivation, personal success and happiness. Client participated in group discussions. Client did leave early due to having another appointment for fixing her car. Client noted that she has been able to somewhat maintain boundaries with her mother, especially when being blamed for something out of her control.  Harlon DittyKarissa A Nilton Lave, LCSW

## 2018-10-13 NOTE — Progress Notes (Deleted)
BH MD/PA/NP OP Progress Note  10/13/2018 8:03 AM Patricia Arnold  MRN:  [id number]   Evaluation: Patricia Arnold seen due to presentation reported by treatment team.  Patricia Arnold is awake alert and oriented x3.  Continues to express concerns with depression and anxiety related to her current work stressors.  Patient presents slightly pressured   History: Per assessment note:-Patricia Arnold is a 42 year old African-American female presents with worsening depression and anxiety related to stresses at work.  Reports she is employed by [employer].  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 Patient report a  disagreement between she and another coworker. States she feels as if  her immediate supervisor is always taken the other coworker side of the disagreement/discussion.  Patient reports she attempted to file numerous complaints however without any resolution.  Patient reports she was asked to follow-up with appointment assistance program [employer] who advised her to come in for mental health assessment. Elody expressed mild paranoia she states her coworkers are out to get her due to her expressing her concerns with the company.  Does states " I dont want to hurt myself but I will hurt someone else if I am provoked"  Visit Diagnosis:    ICD-10-CM   1. Mild episode of recurrent major depressive disorder (HCC) F33.0     Past Psychiatric History:   Past Medical History:  Past Medical History:  Diagnosis Date  . Anemia   . Asthma   . Bipolar depression (HCC)   . Depression   . Headache(784.0)   . Uveitis     Past Surgical History:  Procedure Laterality Date  . ELBOW SURGERY    . reconstrictive      Family Psychiatric History:   Family History:  Family History  Problem Relation Age of  Onset  . Bipolar disorder Mother   . Other Neg Hx     Social History:  Social History   Socioeconomic History  . Marital status: Single    Spouse name: Not on file  . Number of children: Not on file  . Years of education: Not on file  . Highest education level: Not on file  Occupational History  . Not on file  Social Needs  . Financial resource strain: Not on file  . Food insecurity:    Worry: Not on file    Inability: Not on file  . Transportation needs:    Medical: Not on file    Non-medical: Not on file  Tobacco Use  . Smoking status: Current Every Day Smoker    Packs/day: 0.50    Types: Cigarettes  . Smokeless tobacco: Never Used  Substance and Sexual Activity  . Alcohol use: No    Comment: occ  . Drug use: No  . Sexual activity: Not Currently    Birth control/protection: Pill  Lifestyle  . Physical activity:    Days per week: Not on file    Minutes per session: Not on file  . Stress: Not on file  Relationships  . Social connections:    Talks on phone: Not on file    Gets together: Not on file    Attends religious service: Not on file    Active member of club or organization: Not on file    Attends meetings of clubs or organizations: Not  on file    Relationship status: Not on file  Other Topics Concern  . Not on file  Social History Narrative  . Not on file    Allergies:  Allergies  Allergen Reactions  . Namenda [Memantine Hcl] Other (See Comments)    Loss of vision, causes very bad bleeding/period  . Penicillins Anaphylaxis  . Sulfa Antibiotics Anaphylaxis  . Tizanidine Anaphylaxis  . Tramadol Anaphylaxis    Metabolic Disorder Labs: No results found for: HGBA1C, MPG No results found for: PROLACTIN No results found for: CHOL, TRIG, HDL, CHOLHDL, VLDL, LDLCALC Lab Results  Component Value Date   TSH 0.63 02/18/2017   TSH 0.665 03/16/2014    Therapeutic Level Labs: No results found for: LITHIUM No results found for: VALPROATE No  components found for:  CBMZ  Current Medications: Current Outpatient Medications  Medication Sig Dispense Refill  . norethindrone-ethinyl estradiol (BLISOVI FE 1/20) 1-20 MG-MCG tablet Take 1 tablet by mouth daily.     No current facility-administered medications for this visit.      Musculoskeletal: Strength & Muscle Tone: within normal limits Gait & Station: normal Patient leans: N/A  Psychiatric Specialty Exam: ROS  unknown if currently breastfeeding.There is no height or weight on file to calculate BMI.  General Appearance: {Appearance:22683}  Eye Contact:  {BHH EYE CONTACT:22684}  Speech:  {Speech:22685}  Volume:  {Volume (PAA):22686}  Mood:  {BHH MOOD:22306}  Affect:  {Affect (PAA):22687}  Thought Process:  {Thought Process (PAA):22688}  Orientation:  {BHH ORIENTATION (PAA):22689}  Thought Content: {Thought Content:22690}   Suicidal Thoughts:  {ST/HT (PAA):22692}  Homicidal Thoughts:  {ST/HT (PAA):22692}  Memory:  {BHH MEMORY:22881}  Judgement:  {Judgement (PAA):22694}  Insight:  {Insight (PAA):22695}  Psychomotor Activity:  {Psychomotor (PAA):22696}  Concentration:  {Concentration:21399}  Recall:  {BHH GOOD/FAIR/POOR:22877}  Fund of Knowledge: {BHH GOOD/FAIR/POOR:22877}  Language: {BHH GOOD/FAIR/POOR:22877}  Akathisia:  {BHH YES OR NO:22294}  Handed:  {Handed:22697}  AIMS (if indicated): {Desc; done/not:10129}  Assets:  {Assets (PAA):22698}  ADL's:  {BHH ZOX'W:96045}  Cognition: {chl bhh cognition:304700322}  Sleep:  {BHH GOOD/FAIR/POOR:22877}   Screenings:   Assessment and Plan: ***   Oneta Rack, NP 10/13/2018, 8:03 AM

## 2018-10-13 NOTE — Progress Notes (Signed)
BH MD/PA/NP OP Progress Note  10/13/2018 8:58 AM Patricia Arnold  MRN:  [id number]   Evaluation: Patricia Arnold seen due to presentation reported by treatment team.  Patricia Arnold is awake, alert and oriented x3.  Continues to express concerns with depression and anxiety related to her current work stressors.  Patient presents slightly pressured with her focus and attention related to her employment stressors.  Patient appears to be fixated with "fairness and respect" as relates to her employer.  Continues to deny suicidal or homicidal ideations.  Denies auditory or visual hallucinations.  Patient reports some irritability as this relates to "dealing with my mother" as she states her mother is battling bipolar depression reports her emotions can be overwhelming at times.  Discussed initiating mood stabilizer however patient continues to be apprehensive with all medication management.  Discussed benefits related to talk therapy and medication management patient reports she will think about it.  Per treatment team patient has follow-up appointment with primary care provider early part of next week.  Support encouragement reassurance was provided.   History: Per assessment note:-Patricia Arnold is a 42 year old African-American female presents with worsening depression and anxiety related to stresses at work.  Reports she is employed by [employer].  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 Patient report a  disagreement between she and another coworker. States she feels as if  her immediate supervisor is always taken the other coworker side of the disagreement/discussion.  Patient reports she attempted to file numerous complaints however without any resolution.  Patient reports she was asked to follow-up with  appointment assistance program [employer] who advised her to come in for mental health assessment. Vieno expressed mild paranoia she states her coworkers are out to get her due to her expressing her concerns with the company.  Does states " I dont want to hurt myself but I will hurt someone else if I am provoked"   Visit Diagnosis:    ICD-10-CM   1. Mild episode of recurrent major depressive disorder (HCC) F33.0     Past Psychiatric History:  Past Medical History:  Past Medical History:  Diagnosis Date  . Anemia   . Asthma   . Bipolar depression (HCC)   . Depression   . Headache(784.0)   . Uveitis     Past Surgical History:  Procedure Laterality Date  . ELBOW SURGERY    . reconstrictive      Family Psychiatric History:   Family History:  Family History  Problem Relation Age of Onset  . Bipolar disorder Mother   . Other Neg Hx     Social History:  Social History   Socioeconomic History  . Marital status: Single    Spouse name: Not on file  . Number of children: Not on file  . Years of education: Not on file  . Highest education level: Not on file  Occupational History  . Not on file  Social Needs  . Financial resource strain: Not on file  . Food insecurity:    Worry: Not on file    Inability: Not on file  . Transportation needs:    Medical: Not on file    Non-medical: Not on file  Tobacco Use  . Smoking status: Current Every Day Smoker    Packs/day: 0.50  Types: Cigarettes  . Smokeless tobacco: Never Used  Substance and Sexual Activity  . Alcohol use: No    Comment: occ  . Drug use: No  . Sexual activity: Not Currently    Birth control/protection: Pill  Lifestyle  . Physical activity:    Days per week: Not on file    Minutes per session: Not on file  . Stress: Not on file  Relationships  . Social connections:    Talks on phone: Not on file    Gets together: Not on file    Attends religious service: Not on file    Active member of club or  organization: Not on file    Attends meetings of clubs or organizations: Not on file    Relationship status: Not on file  Other Topics Concern  . Not on file  Social History Narrative  . Not on file    Allergies:  Allergies  Allergen Reactions  . Namenda [Memantine Hcl] Other (See Comments)    Loss of vision, causes very bad bleeding/period  . Penicillins Anaphylaxis  . Sulfa Antibiotics Anaphylaxis  . Tizanidine Anaphylaxis  . Tramadol Anaphylaxis    Metabolic Disorder Labs: No results found for: HGBA1C, MPG No results found for: PROLACTIN No results found for: CHOL, TRIG, HDL, CHOLHDL, VLDL, LDLCALC Lab Results  Component Value Date   TSH 0.63 02/18/2017   TSH 0.665 03/16/2014    Therapeutic Level Labs: No results found for: LITHIUM No results found for: VALPROATE No components found for:  CBMZ  Current Medications: Current Outpatient Medications  Medication Sig Dispense Refill  . norethindrone-ethinyl estradiol (BLISOVI FE 1/20) 1-20 MG-MCG tablet Take 1 tablet by mouth daily.     No current facility-administered medications for this visit.      Musculoskeletal: Strength & Muscle Tone: within normal limits Gait & Station: normal Patient leans: N/A  Psychiatric Specialty Exam: ROS  unknown if currently breastfeeding.There is no height or weight on file to calculate BMI.  General Appearance: Casual and Guarded  Eye Contact:  Good  Speech:  Clear and Coherent and Pressured  Volume:  Normal  Mood:  Anxious  Affect:  Congruent  Thought Process:  Coherent  Orientation:  Full (Time, Place, and Person)  Thought Content: Logical and Rumination   Suicidal Thoughts:  No  Homicidal Thoughts:  No  Memory:  Immediate;   Fair Recent;   Fair Remote;   Fair  Judgement:  Fair  Insight:  Lacking  Psychomotor Activity:  Normal  Concentration:  Concentration: Fair  Recall:  [employer]  Fund of Knowledge: Fair  Language: Fair  Akathisia:  No  Handed:  Right  AIMS (if  indicated):   Assets:  Communication Skills Desire for Improvement Social Support Talents/Skills  ADL's:  Intact  Cognition: WNL  Sleep:  Fair   Screenings:   Assessment and Plan:  Continue intensive outpatient program (IOP) Consider initiating a mood stabilizer Keep follow-up appointments with primary care provider  Treatment plan was reviewed and agreed upon by NP Chilton Greathouse Rayli Wiederhold and patient Patricia Arnold need for continued group services.  Oneta Rackanika N Marialuisa Basara, NP 10/13/2018, 8:58 AM

## 2018-10-14 ENCOUNTER — Other Ambulatory Visit (HOSPITAL_COMMUNITY): Payer: 59 | Admitting: Licensed Clinical Social Worker

## 2018-10-14 DIAGNOSIS — F330 Major depressive disorder, recurrent, mild: Secondary | ICD-10-CM | POA: Diagnosis not present

## 2018-10-14 NOTE — Progress Notes (Signed)
    Daily Group Progress Note  Program: IOP  Group Time: 9am-12pm  Participation Level: Active  Behavioral Response: Appropriate  Type of Therapy: Group Therapy; process group, psycho-educational group  ?  Summary of Progress:  The purpose of this group is to utilize CBT and DBT skills in a group setting ton increase use of healthy coping skills and decrease intensity and frequency of active mental health symptoms. 9am-10:30am Clinician checked in with group members, assessing for SI/HI/psychosis and level of functioning. Clinician inquired about goals met over the weekend and any skills attempted. Clinician praised members for progress.  Clinician presented the topic of Co-dependency Characteristics. Clinician described co-dependency and how this affects communication styles and relationships with ourselves and others. Clinician requested group members complete Codependency Characteristics Scale to identify traits of Caretaking, Self-Worth and Dependency. Clinician and group members processed how this can currently been seen in their daily lives.  10:30am-12pm Clinician facilitated discussion with group members about levels of attachment, family dysfunction and self worth, dependency and boundary needs, communication skills, and responsibilities. Clinician and group members processed thoughts and feelings related to these behaviors, and desires to change as well as boundaries to changing.  Clinician challenged each group member to create one moment of joy over the rest of the day.  Client participated in discussion related to mother/daughter dynamics, self esteem and not bringing up the past. Client reports plans to be transferred to another position within the company when she returns to work, as she is not in a position to quit at this time due to health concerns.    Olegario Messier, LCSW

## 2018-10-15 ENCOUNTER — Other Ambulatory Visit (HOSPITAL_COMMUNITY): Payer: 59 | Admitting: Licensed Clinical Social Worker

## 2018-10-15 DIAGNOSIS — F330 Major depressive disorder, recurrent, mild: Secondary | ICD-10-CM | POA: Diagnosis not present

## 2018-10-15 NOTE — Progress Notes (Signed)
  [facility] Discharge Summary  Patricia Arnold [id number]  Admission date: 09/28/2018 Discharge date: 10/15/2018  Reason for admission: Per assessment note: Patricia Arnold is a 42 year old African-American female presents with worsening depression and anxiety related to stresses at work.  Reports she is employed by [employer].  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 Patient report a  disagreement between she and another coworker. States she feels as if  her immediate supervisor is always taken the other coworker side of the disagreement/discussion.  Patient reports she attempted to file numerous complaints however without any resolution.  Patient reports she was asked to follow-up with appointment assistance program EAP who advised her to come in for mental health assessment. Patricia Arnold expressed mild paranoia she states her coworkers are out to get her due to her expressing her concerns with the company.  Does states " I dont want to hurt myself but I will hurt someone else if I am provoked."  Chemical Use History: Was denied  Family of Origin Issues: Patricia Arnold continues to report "good and bad days" regarding the relationship between she and her mother.  Reports her son continues to be supportive doing her outpatient treatment.  Progress in Program Toward Treatment Goals: Ongoing, Patricia Arnold attended and participated with daily group sessions.  Continues to ruminate with " hostile work environment" reports worsening anxiety related to going back to work.  Patient has requested a letter asking to be transferred to a new department.  Provided education that she would need to follow-up with her management at her company.  Patient appeared to  understand and reports she will email her employer today.  Continue to encourage consideration with medication- trial for mood stabilization however patient continues to declined.  Continues to deny suicidal homicidal ideations.  Denies auditory visual hallucinations.  Support encouragement reassurance was provided.  Progress (rationale): Follow-up with Elisabeth MostKarissa Bron , LCSW 10/19/2018 @ 1200  Take all medications as prescribed. Keep all follow-up appointments as scheduled.  Do not consume alcohol or use illegal drugs while on prescription medications. Report any adverse effects from your medications to your primary care provider promptly.  In the event of recurrent symptoms or worsening symptoms, call 911, a crisis hotline, or go to the nearest emergency department for evaluation.    Harlon DittyKarissa A Brone, LCSW 10/16/2018

## 2018-10-15 NOTE — Patient Instructions (Signed)
D:  Patient completed MH-IOP today.  A:  Follow up with Fulton ReekKarissa Brone, LCSW on 10-19-18 @ 12 pm.  Patient will follow up with PCP if she decides she would like medication.  Return back to work on 10-25-18; without any restrictions.  R:  Pt receptive.

## 2018-10-15 NOTE — Progress Notes (Signed)
Patricia Arnold is a 42 y.o. , single, employed, [place] American female who was referred per her EAP; treatment for worsening depressive and anxiety symptoms.  Admits to some paranoia.  Pt states she feels that certain coworkers are talking about her and out to "get" her.  Denies SI, HI or A/V hallucinations.  Pt states she's been feeling this way since August 17, 2018.  Triggers/Stressors:  1)  Job of eight months.  Pt is employed by [employer] as a Merchant navy officer.  According to pt, there is conflict between her, shift Nurse, children's.  "The shift leader shows favoritism and bully's me."  Pt states she asked to transfer, but management has blocked it.  "They claim it's due to staffing issues."  Pt reports there was a meeting with HR and the other two (shift leader Camera operator).  Pt states it didn't go very well.  "I could tell that the shift leader and manager had met prior to the meeting to get their stories in sync."  Pt states she left from the meeting and didn't return to work.  Pt admits to one prior psychiatric admission ~ 5-10 yrs ago when she was hospitalized at [facility] d/t depression with paranoia.  Family Hx:  Mother (Bipolar D/O).  Pt completed MH-IOP today.  Although depressive sx's have decreased, pt c/o anxiety.  States she is very anxious about returning to work.  Pt is requesting a letter to be switched to a different department; in which she has already be blocked by management/HR.  Pt states she plans to stay at current job for right now since PCP is treating her for some medical issues (diabetes, hyperthyroidism, etc). Pt denies SI/HI or A/V hallucinations.  A:  D/C today.  F/U with Micheline Chapman, LCSW on 10-19-18 @ 12 noon.  Pt will also f/u with PCP.  Encouraged support groups.  RTW on 10-25-18; without any restrictions.  R:  Pt receptive.                                                                              Carlis Abbott, RITA, M.Ed,CNA

## 2018-10-15 NOTE — Progress Notes (Signed)
    Daily Group Progress Note  Program: IOP  Group Time: 9am-12pm  Participation Level: Active  Behavioral Response: Appropriate  Type of Therapy:  Group Therapy; process group, psycho-educational group  Summary of Progress:  The purpose of this group is to utilize CBT and DBT skills in a group setting to increase use of healthy coping skills and decrease frequency and intensity of active mental health symptoms.  9am-11am: Clinician presented clients with handout on types of coping skills. Clinician and group members reviewed distraction, grounding, emotional release, self love, thought challenging, and access high self as styles of coping skills. Clinician and group members processed pros and cons of each group of skills and when they could be effective. Clinician and group members utilize Ungame questions to match with previously learned skills. Clinician and group members completed A-Z Coping Skills worksheet, identifying previously learned and creative coping skill ideas. Clinician reminded clients of the importance of addressing any physical health needs as they greatly impact mental health symptoms.  11am-12pm Officer from [employer] [place] [employer] presented information on safety around the holidays. Officer also provided information on training officers receive related to dealing with calls involving mental health and domestic violence. Clinician and officer provided group with local resources Barrister's clerkincluding Mobile Crisis, non-emergency dispatch, and [facility] [place]. Client completes IOP on this day. Client's depressive symptoms have decreased and client is better able to identify distorted thoughts and communicate assertively. Client continues to have anxiety about returning to work and possible responses from management.  Harlon DittyKarissa A Naydelin Ziegler, LCSW

## 2018-10-16 ENCOUNTER — Encounter (HOSPITAL_COMMUNITY): Payer: Self-pay | Admitting: Licensed Clinical Social Worker

## 2018-10-18 ENCOUNTER — Other Ambulatory Visit (HOSPITAL_COMMUNITY): Payer: 59

## 2018-10-19 ENCOUNTER — Other Ambulatory Visit (HOSPITAL_COMMUNITY): Payer: 59

## 2018-10-19 ENCOUNTER — Ambulatory Visit (HOSPITAL_COMMUNITY): Payer: 59 | Admitting: Licensed Clinical Social Worker

## 2018-10-20 ENCOUNTER — Other Ambulatory Visit (HOSPITAL_COMMUNITY): Payer: 59

## 2018-10-21 ENCOUNTER — Other Ambulatory Visit (HOSPITAL_COMMUNITY): Payer: 59

## 2018-10-22 ENCOUNTER — Other Ambulatory Visit (HOSPITAL_COMMUNITY): Payer: 59

## 2018-10-25 ENCOUNTER — Other Ambulatory Visit (HOSPITAL_COMMUNITY): Payer: 59

## 2018-10-26 ENCOUNTER — Other Ambulatory Visit (HOSPITAL_COMMUNITY): Payer: 59

## 2018-10-29 ENCOUNTER — Other Ambulatory Visit (HOSPITAL_COMMUNITY): Payer: 59

## 2018-11-01 ENCOUNTER — Other Ambulatory Visit (HOSPITAL_COMMUNITY): Payer: 59

## 2018-11-02 ENCOUNTER — Other Ambulatory Visit (HOSPITAL_COMMUNITY): Payer: 59

## 2018-11-04 ENCOUNTER — Other Ambulatory Visit (HOSPITAL_COMMUNITY): Payer: 59

## 2018-11-05 ENCOUNTER — Other Ambulatory Visit (HOSPITAL_COMMUNITY): Payer: 59

## 2018-11-08 ENCOUNTER — Other Ambulatory Visit (HOSPITAL_COMMUNITY): Payer: 59

## 2018-11-09 ENCOUNTER — Other Ambulatory Visit (HOSPITAL_COMMUNITY): Payer: 59

## 2018-11-10 ENCOUNTER — Other Ambulatory Visit (HOSPITAL_COMMUNITY): Payer: 59

## 2018-11-11 ENCOUNTER — Other Ambulatory Visit (HOSPITAL_COMMUNITY): Payer: 59

## 2018-11-12 ENCOUNTER — Other Ambulatory Visit (HOSPITAL_COMMUNITY): Payer: 59

## 2018-11-15 ENCOUNTER — Other Ambulatory Visit (HOSPITAL_COMMUNITY): Payer: 59

## 2018-11-16 ENCOUNTER — Ambulatory Visit (HOSPITAL_COMMUNITY): Payer: 59 | Admitting: Licensed Clinical Social Worker

## 2018-11-16 ENCOUNTER — Other Ambulatory Visit (HOSPITAL_COMMUNITY): Payer: 59

## 2018-11-17 ENCOUNTER — Other Ambulatory Visit (HOSPITAL_COMMUNITY): Payer: 59

## 2018-11-18 ENCOUNTER — Other Ambulatory Visit (HOSPITAL_COMMUNITY): Payer: 59

## 2018-11-19 ENCOUNTER — Other Ambulatory Visit (HOSPITAL_COMMUNITY): Payer: 59

## 2018-11-22 ENCOUNTER — Other Ambulatory Visit (HOSPITAL_COMMUNITY): Payer: 59

## 2018-12-01 ENCOUNTER — Ambulatory Visit (HOSPITAL_COMMUNITY): Payer: 59 | Admitting: Psychiatry

## 2019-02-07 ENCOUNTER — Other Ambulatory Visit: Payer: Self-pay

## 2019-02-07 ENCOUNTER — Telehealth: Payer: Self-pay | Admitting: Family

## 2019-02-07 ENCOUNTER — Emergency Department (HOSPITAL_COMMUNITY)
Admission: EM | Admit: 2019-02-07 | Discharge: 2019-02-07 | Disposition: A | Discharge status: Home / Self Care | Payer: Self-pay | Attending: Emergency Medicine | Admitting: Emergency Medicine

## 2019-02-07 ENCOUNTER — Encounter (HOSPITAL_COMMUNITY): Payer: Self-pay | Admitting: *Deleted

## 2019-02-07 DIAGNOSIS — F17210 Nicotine dependence, cigarettes, uncomplicated: Secondary | ICD-10-CM | POA: Insufficient documentation

## 2019-02-07 DIAGNOSIS — D573 Sickle-cell trait: Secondary | ICD-10-CM | POA: Insufficient documentation

## 2019-02-07 DIAGNOSIS — R0600 Dyspnea, unspecified: Secondary | ICD-10-CM

## 2019-02-07 DIAGNOSIS — J069 Acute upper respiratory infection, unspecified: Secondary | ICD-10-CM | POA: Insufficient documentation

## 2019-02-07 DIAGNOSIS — J45909 Unspecified asthma, uncomplicated: Secondary | ICD-10-CM | POA: Insufficient documentation

## 2019-02-07 HISTORY — DX: Disorder of thyroid, unspecified: E07.9

## 2019-02-07 NOTE — Progress Notes (Signed)
Based on what you shared with me, I feel your condition warrants further evaluation and I recommend that you be seen for a face to face office visit.     NOTE: If you entered your credit card information for this eVisit, you will not be charged. You may see a "hold" on your card for the $35 but that hold will drop off and you will not have a charge processed.  If you are having a true medical emergency please call 911.  If you need an urgent face to face visit, [facility] has four urgent care centers for your convenience.    PLEASE NOTE: THE [facility] LOCATIONS AND URGENT CARE CLINICS DO NOT HAVE THE TESTING FOR CORONAVIRUS COVID19 AVAILABLE.  IF YOU FEEL YOU NEED THIS TEST YOU MUST GO TO A TRIAGE LOCATION AT ONE OF THE HOSPITAL EMERGENCY DEPARTMENTS   [email or web address] to reserve your spot online an avoid wait times  [facility] [address], [address] [place], [place] [address] Modified hours of operation: Monday-Friday, 10 AM to 6 PM  Saturday & Sunday 10 AM to 4 PM *Across the street from [employer]  [facility] (New Address!) [address], [address] [place], [place] [address] *Just off [address], across the road from [employer]* Modified hours of operation: Monday-Friday, 10 AM to 5 PM  Closed Saturday & Sunday   The following sites will take your insurance:  . [facility]  [phone] Get Driving Directions Find a Provider at this Location  [address] , [place] [address] . 10 am to 8 pm Monday-Friday . 12 pm to 8 pm Saturday-Sunday   . [facility]  [phone] Get Driving Directions Find a Provider at this Location  [address], [address] [place], [place] [address] . 8 am to 8 pm Monday-Friday . 9 am to 6 pm Saturday . 11 am to 6 pm Sunday   . [facility] [place]  [phone] Get Driving Directions  [address] [place], [place] [address] . 8 am to 8 pm Monday-Friday . 8 am to 4 pm Saturday-Sunday   Your e-visit answers were reviewed by a board certified advanced clinical practitioner to complete your personal care plan.  Thank you for using e-Visits. 

## 2019-02-07 NOTE — ED Triage Notes (Signed)
Pt did an E-visit this AM.  Sx'x   Cough , chills with out  Fever , sore throat  . SHOB. Pt was advised to come to Ed.

## 2019-02-07 NOTE — Discharge Instructions (Addendum)
Please read attached information. If you experience any new or worsening signs or symptoms please return to the emergency room for evaluation. Please follow-up with your primary care provider or specialist as discussed. Please use medication prescribed only as directed and discontinue taking if you have any concerning signs or symptoms.   °

## 2019-02-07 NOTE — ED Notes (Signed)
Patient verbalizes understanding of discharge instructions . Opportunity for questions and answers were provided . Armband removed by staff ,Pt discharged from ED. W/C  offered at D/C  and Declined W/C at D/C and was escorted to lobby by RN.  

## 2019-02-07 NOTE — ED Provider Notes (Signed)
[facility] EMERGENCY DEPARTMENT Provider Note   CSN: [id number] Arrival date & time: 02/07/19  1048    History   Chief Complaint Chief Complaint  Patient presents with  . Letter for School/Work    HPI Patricia Arnold is a 43 y.o. female.     HPI   43 year old female presents today with complaints of body aches.  Patient notes symptoms started approximately 5 days ago with body aches worse in the lower back, burning in her chest and shortness of breath.  She notes very minor rhinorrhea.  She denies any fever, productive cough, abdominal pain or any other infectious symptoms.  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  No recent travel.  She does report a history asthma, also is a smoker.  She denies any history DVT or any significant risk factors for pulmonary embolism.  Past Medical History:  Diagnosis Date  . Anemia   . Asthma   . Bipolar depression (HCC)   . Depression   . Headache(784.0)   . Thyroid disease    Hyperthyroid  . Uveitis     Patient Active Problem List   Diagnosis Date Noted  . Primary osteoarthritis of both hands 08/23/2017  . Primary osteoarthritis of both feet 04/29/2017  . Avascular necrosis left knee 03/12/2017  . Polyarthralgia 02/18/2017  . Uveitis 02/18/2017  . Status post reconstruction of ligament of knee joint 02/18/2017  . Sickle cell trait (HCC) 02/18/2017  . History of asthma 02/18/2017  . History of migraine 02/18/2017  . Smoker 02/18/2017  . Vitamin D deficiency 02/18/2017  . Primary osteoarthritis of both knees 02/18/2017  . Adjustment disorder with depressed mood 08/11/2014  . Nausea/vomiting in pregnancy 06/21/2012  . Bacterial vaginosis 06/21/2012  . Viral gastroenteritis 06/21/2012    Past Surgical History:  Procedure Laterality Date  . ELBOW SURGERY    . reconstrictive       OB History    Gravida  4   Para   1   Term  1   Preterm  0   AB  1   Living  1     SAB  1   TAB  0   Ectopic  0   Multiple  0   Live Births               Home Medications    Prior to Admission medications   Medication Sig Start Date End Date Taking? Authorizing Provider  norethindrone-ethinyl estradiol (BLISOVI FE 1/20) 1-20 MG-MCG tablet Take 1 tablet by mouth daily.    [provider]    Family History Family History  Problem Relation Age of Onset  . Bipolar disorder Mother   . Other Neg Hx     Social History Social History   Tobacco Use  . Smoking status: Current Every Day Smoker    Packs/day: 0.50    Types: Cigarettes  . Smokeless tobacco: Never Used  Substance Use Topics  . Alcohol use: No    Comment: occ  . Drug use: No     Allergies   Namenda [memantine hcl]; Penicillins; Sulfa antibiotics; Tizanidine; and Tramadol   Review of Systems Review of Systems  All other systems reviewed and are negative.    Physical Exam Updated Vital Signs Ht 5\' 2"  (1.575 m)   Wt 52.2 kg   LMP 02/06/2019   BMI 21.03 kg/m  Physical Exam Vitals signs and nursing note reviewed.  Constitutional:      Appearance: She is well-developed.  HENT:     Head: Normocephalic and atraumatic.     Comments: Bilateral TMs normal oropharynx clear no erythema edema or exudate, no rhinorrhea noted Eyes:     General: No scleral icterus.       Right eye: No discharge.        Left eye: No discharge.     Conjunctiva/sclera: Conjunctivae normal.     Pupils: Pupils are equal, round, and reactive to light.  Neck:     Musculoskeletal: Normal range of motion.     Vascular: No JVD.     Trachea: No tracheal deviation.  Pulmonary:     Effort: Pulmonary effort is normal. No respiratory distress.     Breath sounds: Normal breath sounds. No stridor. No wheezing, rhonchi or rales.  Neurological:     Mental Status: She is alert and oriented to person, place, and time.     Coordination: Coordination  normal.  Psychiatric:        Behavior: Behavior normal.        Thought Content: Thought content normal.        Judgment: Judgment normal.      ED Treatments / Results  Labs (all labs ordered are listed, but only abnormal results are displayed) Labs Reviewed - No data to display  EKG None  Radiology No results found.  Procedures Procedures (including critical care time)  Medications Ordered in ED Medications - No data to display   Initial Impression / Assessment and Plan / ED Course  I have reviewed the triage vital signs and the nursing notes.  Pertinent labs & imaging results that were available during my care of the patient were reviewed by me and considered in my medical decision making (see chart for details).        43 year old female presents today with likely viral illness.  She has no objective findings on my exam is very well-appearing in no acute distress.  Although coronavirus is on my differential I have low suspicion given her appearance here.  Patient discharged with symptomatic care strict return precautions.  She verbalized understanding and agreement to today's plan had no further questions or concerns. Final Clinical Impressions(s) / ED Diagnoses   Final diagnoses:  Viral URI    ED Discharge Orders    None       Rosalio Loud 02/08/19 1528    Linwood Dibbles, MD 02/09/19 [phone]

## 2019-06-22 ENCOUNTER — Ambulatory Visit: Payer: Medicaid Other | Admitting: Obstetrics and Gynecology

## 2019-08-25 ENCOUNTER — Other Ambulatory Visit (HOSPITAL_COMMUNITY)
Admission: RE | Admit: 2019-08-25 | Discharge: 2019-08-25 | Disposition: A | Discharge status: Home / Self Care | Payer: Medicaid Other | Source: Ambulatory Visit | Attending: Obstetrics and Gynecology | Admitting: Obstetrics and Gynecology

## 2019-08-25 ENCOUNTER — Encounter: Payer: Self-pay | Admitting: Obstetrics and Gynecology

## 2019-08-25 ENCOUNTER — Other Ambulatory Visit: Payer: Self-pay

## 2019-08-25 ENCOUNTER — Ambulatory Visit (INDEPENDENT_AMBULATORY_CARE_PROVIDER_SITE_OTHER): Payer: Medicaid Other | Admitting: Obstetrics and Gynecology

## 2019-08-25 VITALS — BP 96/63 | HR 91 | Temp 98.6°F | Ht 62.0 in | Wt 116.8 lb

## 2019-08-25 DIAGNOSIS — Z124 Encounter for screening for malignant neoplasm of cervix: Secondary | ICD-10-CM

## 2019-08-25 DIAGNOSIS — Z01419 Encounter for gynecological examination (general) (routine) without abnormal findings: Secondary | ICD-10-CM | POA: Insufficient documentation

## 2019-08-25 DIAGNOSIS — Z3009 Encounter for other general counseling and advice on contraception: Secondary | ICD-10-CM

## 2019-08-25 DIAGNOSIS — Z0000 Encounter for general adult medical examination without abnormal findings: Secondary | ICD-10-CM

## 2019-08-25 DIAGNOSIS — Z113 Encounter for screening for infections with a predominantly sexual mode of transmission: Secondary | ICD-10-CM

## 2019-08-25 MED ORDER — NORETHINDRONE 0.35 MG PO TABS: 1.0000 | ORAL_TABLET | Freq: Every day | ORAL | 11 refills | Status: DC

## 2019-08-25 NOTE — Progress Notes (Signed)
New GYN presents for AEX/PAP/STD testing.   Need refills on OCP.  Declined the FLU.  GAD-7=2

## 2019-08-25 NOTE — Addendum Note (Signed)
Addended by: Vivien Rota on: 08/25/2019 09:03 AM   Modules accepted: Orders

## 2019-08-25 NOTE — Progress Notes (Signed)
GYNECOLOGY ANNUAL PREVENTATIVE CARE ENCOUNTER NOTE  Subjective:   Patricia Arnold is a 43 y.o. G11P1021 female here for a annual gynecologic exam. Current complaints: period was very heavy when she started methotrexate in September.  Denies abnormal vaginal bleeding, discharge, pelvic pain, problems with intercourse or other gynecologic concerns.    Gynecologic History Patient's last menstrual period was 08/07/2019 (exact date). Contraception: OCP (estrogen/progesterone) Last Pap: 2019. Results were: normal Last mammogram: More than 1 year. Results were: normal  Obstetric History OB History  Gravida Para Term Preterm AB Living  3 1 1  0 2 1  SAB TAB Ectopic Multiple Live Births  1 0 0 0      # Outcome Date GA Lbr Len/2nd Weight Sex Delivery Anes PTL Lv  3 AB           2 SAB           1 Term             Past Medical History:  Diagnosis Date  . Anemia   . Anxiety   . Arthritis   . Asthma   . Bipolar depression (HCC)   . Depression   . Headache(784.0)   . Hypertension   . Thyroid disease    Hyperthyroid  . Uveitis    Past Surgical History:  Procedure Laterality Date  . ELBOW SURGERY    . FRACTURE SURGERY    . reconstrictive     Current Outpatient Medications on File Prior to Visit  Medication Sig Dispense Refill  . folic acid (FOLVITE) 1 MG tablet Take by mouth.    . methotrexate (RHEUMATREX) 2.5 MG tablet Take 6 tablets by mouth weekly    . norethindrone-ethinyl estradiol (BLISOVI FE 1/20) 1-20 MG-MCG tablet Take 1 tablet by mouth daily.     No current facility-administered medications on file prior to visit.     Allergies  Allergen Reactions  . Namenda [Memantine Hcl] Other (See Comments)    Loss of vision, causes very bad bleeding/period  . Penicillins Anaphylaxis  . Sulfa Antibiotics Anaphylaxis  . Tizanidine Anaphylaxis  . Tramadol Anaphylaxis    Social History   Socioeconomic History  . Marital status: Single    Spouse name: Not on file   . Number of children: 1  . Years of education: Not on file  . Highest education level: Not on file  Occupational History  . 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 Financial resource strain: Not on file  . Food insecurity    Worry: Not on file    Inability: Not on file  . Transportation needs    Medical: Not on file    Non-medical: Not on file  Tobacco Use  . Smoking status: Current Every Day Smoker    Packs/day: 0.50    Types: Cigarettes  . Smokeless tobacco: Never Used  Substance and Sexual Activity  . Alcohol use: Yes    Comment: occ  . Drug use: No  . Sexual activity: Yes    Birth control/protection: Pill  Lifestyle  . Physical activity    Days per week: Not on file    Minutes per session: Not on file  . Stress: Not on file  Relationships  . Social Consulting civil engineer on phone: Not on file    Gets together: Not on file    Attends religious service: Not on file    Active member of club or organization: Not on file    Attends  meetings of clubs or organizations: Not on file    Relationship status: Not on file  . Intimate partner violence    Fear of current or ex partner: Not on file    Emotionally abused: Not on file    Physically abused: Not on file    Forced sexual activity: Not on file  Other Topics Concern  . Not on file  Social History Narrative  . Not on file    Family History  Problem Relation Age of Onset  . Bipolar disorder Mother   . Arthritis Mother   . Depression Mother   . Varicose Veins Mother   . Arthritis Sister   . Diabetes Maternal Grandmother   . Other Neg Hx    The following portions of the patient's history were reviewed and updated as appropriate: allergies, current medications, past family history, past medical history, past social history, past surgical history and problem list.  Review of Systems Pertinent items are noted in HPI.   Objective:  BP 96/63   Pulse 91   Temp 98.6 F (37 C)   Ht 5\' 2"  (1.575 m)   Wt 116 lb 12.8  oz (53 kg)   LMP 08/07/2019 (Exact Date)   BMI 21.36 kg/m  CONSTITUTIONAL: Well-developed, well-nourished female in no acute distress.  HENT:  Normocephalic, atraumatic, External right and left ear normal. Oropharynx is clear and moist EYES: Conjunctivae and EOM are normal. Pupils are equal, round, and reactive to light. No scleral icterus.  NECK: Normal range of motion, supple, no masses.  Normal thyroid.  SKIN: Skin is warm and dry. No rash noted. Not diaphoretic. No erythema. No pallor. NEUROLOGIC: Alert and oriented to person, place, and time. Normal reflexes, muscle tone coordination. No cranial nerve deficit noted. PSYCHIATRIC: Normal mood and affect. Normal behavior. Normal judgment and thought content. CARDIOVASCULAR: Normal heart rate noted, regular rhythm RESPIRATORY: Clear to auscultation bilaterally. Effort and breath sounds normal, no problems with respiration noted. BREASTS: Symmetric in size. No masses, skin changes, nipple drainage, or lymphadenopathy. ABDOMEN: Soft, normal bowel sounds, no distention noted.  No tenderness, rebound or guarding.  PELVIC: Normal appearing external genitalia; normal appearing vaginal mucosa and cervix.  No abnormal discharge noted.  Pap smear obtained.  Normal uterine size, no other palpable masses, no uterine or adnexal tenderness. MUSCULOSKELETAL: Normal range of motion. No tenderness.  No cyanosis, clubbing, or edema.  2+ distal pulses.  Exam done with chaperone present.  Assessment and Plan:   1. Well woman exam Counseled regarding risks/benefits of flu vaccine, patient declines vaccine.  - Cervicovaginal ancillary only( [place])  2. Routine screening for STI (sexually transmitted infection) - HIV antibody (with reflex) - Hepatitis B Core Antibody, total - Hepatitis C Antibody - RPR  3. Cervical cancer screening - Cytology - PAP( [place])  4. Encounter for counseling regarding contraception Reviewed elevated risk of  stroke/clot with OCPs in the setting of smoking, recommend alternate method, patient declines shot or implant, recommend POPs, she is agreeable, sent to pharmacy, to call with any issues  Will follow up results of pap smear/STI screen and manage accordingly. Encouraged improvement in diet and exercise.  Mammogram has been ordered Declined Flu vaccine today  Routine preventative health maintenance measures emphasized. Please refer to After Visit Summary for other counseling recommendations.   Total face-to-face time with patient: 30 minutes. Over 50% of encounter was spent on counseling and coordination of care.   Feliz Beam, M.D. Attending Center for [employer] (  Water engineer)

## 2019-08-26 LAB — HEPATITIS B CORE ANTIBODY, TOTAL: Hep B Core Total Ab: NEGATIVE

## 2019-08-26 LAB — CERVICOVAGINAL ANCILLARY ONLY
Chlamydia: NEGATIVE
Comment: NEGATIVE
Comment: NORMAL
Neisseria Gonorrhea: NEGATIVE

## 2019-08-26 LAB — CYTOLOGY - PAP
Comment: NEGATIVE
Diagnosis: NEGATIVE
High risk HPV: NEGATIVE

## 2019-08-26 LAB — HEPATITIS B SURFACE ANTIGEN: Hepatitis B Surface Ag: NEGATIVE

## 2019-08-26 LAB — HIV ANTIBODY (ROUTINE TESTING W REFLEX): HIV Screen 4th Generation wRfx: NONREACTIVE

## 2019-08-26 LAB — RPR: RPR Ser Ql: NONREACTIVE

## 2019-08-26 LAB — HEPATITIS C ANTIBODY: Hep C Virus Ab: <0.1 s/co ratio (ref 0.0–0.9)

## 2020-08-27 ENCOUNTER — Ambulatory Visit (INDEPENDENT_AMBULATORY_CARE_PROVIDER_SITE_OTHER): Payer: Medicaid Other | Admitting: Obstetrics and Gynecology

## 2020-08-27 ENCOUNTER — Encounter: Payer: Self-pay | Admitting: Obstetrics and Gynecology

## 2020-08-27 ENCOUNTER — Other Ambulatory Visit: Payer: Self-pay

## 2020-08-27 ENCOUNTER — Other Ambulatory Visit (HOSPITAL_COMMUNITY)
Admission: RE | Admit: 2020-08-27 | Discharge: 2020-08-27 | Disposition: A | Discharge status: Home / Self Care | Payer: Medicaid Other | Source: Ambulatory Visit | Attending: Obstetrics and Gynecology | Admitting: Obstetrics and Gynecology

## 2020-08-27 VITALS — BP 118/74 | HR 90 | Wt 121.6 lb

## 2020-08-27 DIAGNOSIS — Z3009 Encounter for other general counseling and advice on contraception: Secondary | ICD-10-CM | POA: Diagnosis not present

## 2020-08-27 DIAGNOSIS — Z113 Encounter for screening for infections with a predominantly sexual mode of transmission: Secondary | ICD-10-CM

## 2020-08-27 DIAGNOSIS — Z1231 Encounter for screening mammogram for malignant neoplasm of breast: Secondary | ICD-10-CM | POA: Diagnosis not present

## 2020-08-27 DIAGNOSIS — N939 Abnormal uterine and vaginal bleeding, unspecified: Secondary | ICD-10-CM | POA: Diagnosis not present

## 2020-08-27 MED ORDER — NORETHINDRONE 0.35 MG PO TABS: 1.0000 | ORAL_TABLET | Freq: Every day | ORAL | 3 refills | Status: DC

## 2020-08-27 NOTE — Progress Notes (Signed)
GYNECOLOGY OFFICE NOTE  History:  44 y.o. [id number] here today for irregular intermenstrual bleeding. She has normal periods, lasting 3-4 days, then this last period, she had very heavy, clotting bleeding for 14 days that started a week after her normal period ended.  Was very abnormal for her. Was very painful, felt like "twisting" on the right side. Has not happened since and she is usually very regular.  She reports she has had some "odd feelings" since her schedule has lightened. 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 Could be anxiety.  Past Medical History:  Diagnosis Date  . Anemia   . Anxiety   . Arthritis   . Asthma   . Bipolar depression (HCC)   . Depression   . Headache(784.0)   . Hypertension   . Thyroid disease    Hyperthyroid  . Uveitis     Past Surgical History:  Procedure Laterality Date  . ELBOW SURGERY    . FRACTURE SURGERY    . reconstrictive      Current Outpatient Medications:  .  norethindrone (MICRONOR) 0.35 MG tablet, Take 1 tablet (0.35 mg total) by mouth daily., Disp: 84 tablet, Rfl: 3 .  folic acid (FOLVITE) 1 MG tablet, Take by mouth. (Patient not taking: Reported on 08/27/2020), Disp: , Rfl:  .  methotrexate (RHEUMATREX) 2.5 MG tablet, Take 6 tablets by mouth weekly (Patient not taking: Reported on 08/27/2020), Disp: , Rfl:   The following portions of the patient's history were reviewed and updated as appropriate: allergies, current medications, past family history, past medical history, past social history, past surgical history and problem list.   Review of Systems:  Pertinent items noted in HPI and remainder of comprehensive ROS otherwise negative.   Objective:  Physical Exam BP 118/74   Pulse 90   Wt 121 lb 9.6 oz (55.2 kg)   LMP 08/10/2020   BMI 22.24 kg/m  CONSTITUTIONAL: Well-developed, well-nourished female in no acute distress.  HENT:  Normocephalic, atraumatic. External right and left ear normal.  Oropharynx is clear and moist EYES: Conjunctivae and EOM are normal. Pupils are equal, round, and reactive to light. No scleral icterus.  NECK: Normal range of motion, supple, no masses SKIN: Skin is warm and dry. No rash noted. Not diaphoretic. No erythema. No pallor. NEUROLOGIC: Alert and oriented to person, place, and time. Normal reflexes, muscle tone coordination. No cranial nerve deficit noted. PSYCHIATRIC: Normal mood and affect. Normal behavior. Normal judgment and thought content. CARDIOVASCULAR: Normal heart rate noted RESPIRATORY: Effort normal, no problems with respiration noted ABDOMEN: Soft, no distention noted.   PELVIC: Normal appearing external genitalia; normal appearing vaginal mucosa and cervix.  No abnormal discharge noted.  pelvic cultures obtained.  MUSCULOSKELETAL: Normal range of motion. No edema noted.  Exam done with chaperone present.  Labs and Imaging No results found.  Assessment & Plan:  1. Abnormal uterine bleeding (AUB) With one occurrence, likely stress related Will get some labs and see If recurs, will order [place] and for EMB - TSH - Prolactin  2. Routine screening for STI (sexually transmitted infection) - Cervicovaginal ancillary only( [place]) - Hepatitis B surface antigen - Hepatitis C antibody - HIV Antibody (routine testing w rflx) - RPR  3. Encounter for counseling regarding contraception Happy with micornor Rx sent today for 1 year refill  4. Encounter for screening mammogram for malignant neoplasm of breast Encouraged to get mammogram  COVID vaccine UTD   Routine preventative health maintenance  measures emphasized. Please refer to After Visit Summary for other counseling recommendations.   Return if symptoms worsen or fail to improve.   Baldemar Lenis, M.D. Attending Center for [employer] [facility]wife)

## 2020-08-27 NOTE — Progress Notes (Signed)
Pt presents for initial c/o irregular periods Normal period 07/14/20 - 07/17/2020  She started bleeding heavier than last time, painful, and with clots 07/27/20 - 08/10/2020 Taking Micronor daily Not taking Methotrexate anymore  Pt has questions regarding PCOS  Normal pap 08/25/2019 Mammogram was ordered but she hasn't scheduled the appt yet

## 2020-08-27 NOTE — Progress Notes (Signed)
RGYN patient presents for problem visit today.  CC:   LMP:

## 2020-08-28 LAB — CERVICOVAGINAL ANCILLARY ONLY
Bacterial Vaginitis (gardnerella): POSITIVE — AB
Candida Glabrata: NEGATIVE
Candida Vaginitis: POSITIVE — AB
Chlamydia: NEGATIVE
Comment: NEGATIVE
Comment: NEGATIVE
Comment: NEGATIVE
Comment: NEGATIVE
Comment: NEGATIVE
Comment: NORMAL
Neisseria Gonorrhea: NEGATIVE
Trichomonas: NEGATIVE

## 2020-08-28 LAB — PROLACTIN: Prolactin: 9.6 ng/mL (ref 4.8–23.3)

## 2020-08-28 LAB — TSH: TSH: 1.04 u[IU]/mL (ref 0.450–4.500)

## 2020-08-28 LAB — HIV ANTIBODY (ROUTINE TESTING W REFLEX): HIV Screen 4th Generation wRfx: NONREACTIVE

## 2020-08-28 LAB — RPR: RPR Ser Ql: NONREACTIVE

## 2020-08-28 LAB — HEPATITIS C ANTIBODY: Hep C Virus Ab: 0.1 s/co ratio (ref 0.0–0.9)

## 2020-08-28 LAB — HEPATITIS B SURFACE ANTIGEN: Hepatitis B Surface Ag: NEGATIVE

## 2020-08-29 ENCOUNTER — Other Ambulatory Visit: Payer: Self-pay | Admitting: *Deleted

## 2020-08-29 DIAGNOSIS — B3731 Acute candidiasis of vulva and vagina: Secondary | ICD-10-CM

## 2020-08-29 DIAGNOSIS — B373 Candidiasis of vulva and vagina: Secondary | ICD-10-CM

## 2020-08-29 DIAGNOSIS — B9689 Other specified bacterial agents as the cause of diseases classified elsewhere: Secondary | ICD-10-CM

## 2020-08-29 MED ORDER — METRONIDAZOLE 500 MG PO TABS: 500.0000 mg | ORAL_TABLET | Freq: Two times a day (BID) | ORAL | 0 refills | Status: DC

## 2020-08-29 MED ORDER — FLUCONAZOLE 150 MG PO TABS: 150.0000 mg | ORAL_TABLET | Freq: Once | ORAL | 0 refills | Status: AC

## 2020-08-29 NOTE — Progress Notes (Unsigned)
Flagyl and Diflucan sent in for +BV and yeast.

## 2020-12-12 ENCOUNTER — Ambulatory Visit (INDEPENDENT_AMBULATORY_CARE_PROVIDER_SITE_OTHER): Payer: Managed Care, Other (non HMO)

## 2020-12-12 ENCOUNTER — Other Ambulatory Visit: Payer: Self-pay

## 2020-12-12 ENCOUNTER — Other Ambulatory Visit (HOSPITAL_COMMUNITY)
Admission: RE | Admit: 2020-12-12 | Discharge: 2020-12-12 | Disposition: A | Discharge status: Home / Self Care | Payer: Managed Care, Other (non HMO) | Source: Ambulatory Visit | Attending: Women's Health | Admitting: Women's Health

## 2020-12-12 VITALS — BP 111/73 | HR 94 | Ht 62.0 in | Wt 119.6 lb

## 2020-12-12 DIAGNOSIS — Z1231 Encounter for screening mammogram for malignant neoplasm of breast: Secondary | ICD-10-CM

## 2020-12-12 DIAGNOSIS — Z113 Encounter for screening for infections with a predominantly sexual mode of transmission: Secondary | ICD-10-CM | POA: Diagnosis present

## 2020-12-12 NOTE — Progress Notes (Signed)
Pt presents today for yearly exam. She has new insurance and would like to have pap smear and STD screening. LMP: 12/12/20. BC: OCP - Micronor.

## 2020-12-12 NOTE — Patient Instructions (Signed)
Preventive Care 84-45 Years Old, Female Preventive care refers to lifestyle choices and visits with your health care provider that can promote health and wellness. This includes:  A yearly physical exam. This is also called an annual wellness visit.  Regular dental and eye exams.  Immunizations.  Screening for certain conditions.  Healthy lifestyle choices, such as: ? Eating a healthy diet. ? Getting regular exercise. ? Not using drugs or products that contain nicotine and tobacco. ? Limiting alcohol use. What can I expect for my preventive care visit? Physical exam Your health care provider will check your:  Height and weight. These may be used to calculate your BMI (body mass index). BMI is a measurement that tells if you are at a healthy weight.  Heart rate and blood pressure.  Body temperature.  Skin for abnormal spots. Counseling Your health care provider may ask you questions about your:  Past medical problems.  Family's medical history.  Alcohol, tobacco, and drug use.  Emotional well-being.  Home life and relationship well-being.  Sexual activity.  Diet, exercise, and sleep habits.  Work and work Statistician.  Access to firearms.  Method of birth control.  Menstrual cycle.  Pregnancy history. What immunizations do I need? Vaccines are usually given at various ages, according to a schedule. Your health care provider will recommend vaccines for you based on your age, medical history, and lifestyle or other factors, such as travel or where you work.   What tests do I need? Blood tests  Lipid and cholesterol levels. These may be checked every 5 years, or more often if you are over 3 years old.  Hepatitis C test.  Hepatitis B test. Screening  Lung cancer screening. You may have this screening every year starting at age 73 if you have a 30-pack-year history of smoking and currently smoke or have quit within the past 15 years.  Colorectal cancer  screening. ? All adults should have this screening starting at age 52 and continuing until age 17. ? Your health care provider may recommend screening at age 49 if you are at increased risk. ? You will have tests every 1-10 years, depending on your results and the type of screening test.  Diabetes screening. ? This is done by checking your blood sugar (glucose) after you have not eaten for a while (fasting). ? You may have this done every 1-3 years.  Mammogram. ? This may be done every 1-2 years. ? Talk with your health care provider about when you should start having regular mammograms. This may depend on whether you have a family history of breast cancer.  BRCA-related cancer screening. This may be done if you have a family history of breast, ovarian, tubal, or peritoneal cancers.  Pelvic exam and Pap test. ? This may be done every 3 years starting at age 10. ? Starting at age 11, this may be done every 5 years if you have a Pap test in combination with an HPV test. Other tests  STD (sexually transmitted disease) testing, if you are at risk.  Bone density scan. This is done to screen for osteoporosis. You may have this scan if you are at high risk for osteoporosis. Talk with your health care provider about your test results, treatment options, and if necessary, the need for more tests. Follow these instructions at home: Eating and drinking  Eat a diet that includes fresh fruits and vegetables, whole grains, lean protein, and low-fat dairy products.  Take vitamin and mineral supplements  as recommended by your health care provider.  Do not drink alcohol if: ? Your health care provider tells you not to drink. ? You are pregnant, may be pregnant, or are planning to become pregnant.  If you drink alcohol: ? Limit how much you have to 0-1 drink a day. ? Be aware of how much alcohol is in your drink. In the [place] one drink equals one 12 oz bottle of beer (355 mL), one 5 oz glass of  wine (148 mL), or one 1 oz glass of hard liquor (44 mL).   Lifestyle  Take daily care of your teeth and gums. Brush your teeth every morning and night with fluoride toothpaste. Floss one time each day.  Stay active. Exercise for at least 30 minutes 5 or more days each week.  Do not use any products that contain nicotine or tobacco, such as cigarettes, e-cigarettes, and chewing tobacco. If you need help quitting, ask your health care provider.  Do not use drugs.  If you are sexually active, practice safe sex. Use a condom or other form of protection to prevent STIs (sexually transmitted infections).  If you do not wish to become pregnant, use a form of birth control. If you plan to become pregnant, see your health care provider for a prepregnancy visit.  If told by your health care provider, take low-dose aspirin daily starting at age 50.  Find healthy ways to cope with stress, such as: ? Meditation, yoga, or listening to music. ? Journaling. ? Talking to a trusted person. ? Spending time with friends and family. Safety  Always wear your seat belt while driving or riding in a vehicle.  Do not drive: ? If you have been drinking alcohol. Do not ride with someone who has been drinking. ? When you are tired or distracted. ? While texting.  Wear a helmet and other protective equipment during sports activities.  If you have firearms in your house, make sure you follow all gun safety procedures. What's next?  Visit your health care provider once a year for an annual wellness visit.  Ask your health care provider how often you should have your eyes and teeth checked.  Stay up to date on all vaccines. This information is not intended to replace advice given to you by your health care provider. Make sure you discuss any questions you have with your health care provider. Document Revised: 07/24/2020 Document Reviewed: 07/01/2018 Elsevier Patient Education  2021 Elsevier  Inc.    Breast Self-Awareness Breast self-awareness means being familiar with how your breasts look and feel. It involves checking your breasts regularly and reporting any changes to your health care provider. Practicing breast self-awareness is important. Sometimes changes may not be harmful (are benign), but sometimes a change in your breasts can be a sign of a serious medical problem. It is important to learn how to do this procedure correctly so that you can catch problems early, when treatment is more likely to be successful. All women should practice breast self-awareness, including women who have had breast implants. What you need:  A mirror.  A well-lit room. How to do a breast self-exam A breast self-exam is one way to learn what is normal for your breasts and whether your breasts are changing. To do a breast self-exam: Look for changes 1. Remove all the clothing above your waist. 2. Stand in front of a mirror in a room with good lighting. 3. Put your hands on your hips. 4. Push   your hands firmly downward. 5. Compare your breasts in the mirror. Look for differences between them (asymmetry), such as: ? Differences in shape. ? Differences in size. ? Puckers, dips, and bumps in one breast and not the other. 6. Look at each breast for changes in the skin, such as: ? Redness. ? Scaly areas. 7. Look for changes in your nipples, such as: ? Discharge. ? Bleeding. ? Dimpling. ? Redness. ? A change in position.   Feel for changes Carefully feel your breasts for lumps and changes. It is best to do this while lying on your back on the floor, and again while sitting or standing in the tub or shower with soapy water on your skin. Feel each breast in the following way: 1. Place the arm on the side of the breast you are examining above your head. 2. Feel your breast with the other hand. 3. Start in the nipple area and make -inch (2 cm) overlapping circles to feel your breast. Use the  pads of your three middle fingers to do this. Apply light pressure, then medium pressure, then firm pressure. The light pressure will allow you to feel the tissue closest to the skin. The medium pressure will allow you to feel the tissue that is a little deeper. The firm pressure will allow you to feel the tissue close to the ribs. 4. Continue the overlapping circles, moving downward over the breast until you feel your ribs below your breast. 5. Move one finger-width toward the center of the body. Continue to use the -inch (2 cm) overlapping circles to feel your breast as you move slowly up toward your collarbone. 6. Continue the up-and-down exam using all three pressures until you reach your armpit.   Write down what you find Writing down what you find can help you remember what to discuss with your health care provider. Write down:  What is normal for each breast.  Any changes that you find in each breast, including: ? The kind of changes you find. ? Any pain or tenderness. ? Size and location of any lumps.  Where you are in your menstrual cycle, if you are still menstruating. General tips and recommendations  Examine your breasts every month.  If you are breastfeeding, the best time to examine your breasts is after a feeding or after using a breast pump.  If you menstruate, the best time to examine your breasts is 5-7 days after your period. Breasts are generally lumpier during menstrual periods, and it may be more difficult to notice changes.  With time and practice, you will become more familiar with the variations in your breasts and more comfortable with the exam. Contact a health care provider if you:  See a change in the shape or size of your breasts or nipples.  See a change in the skin of your breast or nipples, such as a reddened or scaly area.  Have unusual discharge from your nipples.  Find a lump or thick area that was not there before.  Have pain in your  breasts.  Have any concerns related to your breast health. Summary  Breast self-awareness includes looking for physical changes in your breasts, as well as feeling for any changes within your breasts.  Breast self-awareness should be performed in front of a mirror in a well-lit room.  You should examine your breasts every month. If you menstruate, the best time to examine your breasts is 5-7 days after your menstrual period.  Let your health   care provider know of any changes you notice in your breasts, including changes in size, changes on the skin, pain or tenderness, or unusual fluid from your nipples. This information is not intended to replace advice given to you by your health care provider. Make sure you discuss any questions you have with your health care provider. Document Revised: 06/08/2018 Document Reviewed: 06/08/2018 Elsevier Patient Education  2021 Elsevier Inc.   

## 2020-12-12 NOTE — Progress Notes (Signed)
Patient assumed she was here for mammogram today, not for annual visit. Per messages exchanged with office, she asked for a mammogram and was given this appointment for annual visit. Mammogram order placed and will help patient schedule. Patient had normal Pap 08/2019 (NILM, hrHPV negative) and denies history of abnormal Pap. Patient requests STD testing today, which was completed. Patient had visit with full exam with Dr. Earlene Plater 08/2020. Patient declines provider visit today given circumstances.  BP 111/73 (BP Location: Right Arm, Patient Position: Sitting, Cuff Size: Normal)   Pulse 94   Ht 5\' 2"  (1.575 m)   Wt 119 lb 9.6 oz (54.3 kg)   LMP 12/12/2020   Breastfeeding No   BMI 21.88 kg/m    Adasia Hoar, 02/09/2021, NP  4:47 PM 12/12/2020

## 2020-12-13 LAB — CERVICOVAGINAL ANCILLARY ONLY
Chlamydia: NEGATIVE
Comment: NEGATIVE
Comment: NEGATIVE
Comment: NORMAL
Neisseria Gonorrhea: NEGATIVE
Trichomonas: NEGATIVE

## 2020-12-13 LAB — RPR+HBSAG+HCVAB+...
HIV Screen 4th Generation wRfx: NONREACTIVE
Hep C Virus Ab: <0.1 s/co ratio (ref 0.0–0.9)
Hepatitis B Surface Ag: NEGATIVE
RPR Ser Ql: NONREACTIVE

## 2020-12-17 ENCOUNTER — Other Ambulatory Visit (HOSPITAL_BASED_OUTPATIENT_CLINIC_OR_DEPARTMENT_OTHER): Payer: Self-pay | Admitting: Family Medicine

## 2020-12-17 DIAGNOSIS — Z1231 Encounter for screening mammogram for malignant neoplasm of breast: Secondary | ICD-10-CM

## 2020-12-19 ENCOUNTER — Other Ambulatory Visit: Payer: Self-pay

## 2020-12-19 ENCOUNTER — Ambulatory Visit (HOSPITAL_BASED_OUTPATIENT_CLINIC_OR_DEPARTMENT_OTHER)
Admission: RE | Admit: 2020-12-19 | Discharge: 2020-12-19 | Disposition: A | Discharge status: Home / Self Care | Payer: Managed Care, Other (non HMO) | Source: Ambulatory Visit | Attending: Family Medicine | Admitting: Family Medicine

## 2020-12-19 DIAGNOSIS — Z1231 Encounter for screening mammogram for malignant neoplasm of breast: Secondary | ICD-10-CM | POA: Insufficient documentation

## 2020-12-19 DIAGNOSIS — N6310 Unspecified lump in the right breast, unspecified quadrant: Secondary | ICD-10-CM | POA: Insufficient documentation

## 2020-12-25 ENCOUNTER — Other Ambulatory Visit: Payer: Self-pay | Admitting: Physician Assistant

## 2020-12-25 DIAGNOSIS — N6489 Other specified disorders of breast: Secondary | ICD-10-CM

## 2020-12-25 DIAGNOSIS — R928 Other abnormal and inconclusive findings on diagnostic imaging of breast: Secondary | ICD-10-CM

## 2021-01-03 ENCOUNTER — Other Ambulatory Visit: Payer: Self-pay

## 2021-01-07 ENCOUNTER — Other Ambulatory Visit: Payer: Self-pay

## 2021-01-07 ENCOUNTER — Other Ambulatory Visit: Payer: Self-pay | Admitting: Physician Assistant

## 2021-01-07 ENCOUNTER — Ambulatory Visit
Admission: RE | Admit: 2021-01-07 | Discharge: 2021-01-07 | Disposition: A | Discharge status: Home / Self Care | Payer: Managed Care, Other (non HMO) | Source: Ambulatory Visit | Attending: Physician Assistant | Admitting: Physician Assistant

## 2021-01-07 ENCOUNTER — Ambulatory Visit: Payer: Managed Care, Other (non HMO)

## 2021-01-07 DIAGNOSIS — R921 Mammographic calcification found on diagnostic imaging of breast: Secondary | ICD-10-CM

## 2021-01-07 DIAGNOSIS — R928 Other abnormal and inconclusive findings on diagnostic imaging of breast: Secondary | ICD-10-CM

## 2021-01-15 ENCOUNTER — Ambulatory Visit
Admission: RE | Admit: 2021-01-15 | Discharge: 2021-01-15 | Disposition: A | Discharge status: Home / Self Care | Payer: Managed Care, Other (non HMO) | Source: Ambulatory Visit | Attending: Physician Assistant | Admitting: Physician Assistant

## 2021-01-15 ENCOUNTER — Other Ambulatory Visit: Payer: Self-pay

## 2021-01-15 DIAGNOSIS — R921 Mammographic calcification found on diagnostic imaging of breast: Secondary | ICD-10-CM

## 2021-01-18 ENCOUNTER — Telehealth: Payer: Self-pay

## 2021-01-18 MED ORDER — NORETHINDRONE 0.35 MG PO TABS: 1.0000 | ORAL_TABLET | Freq: Every day | ORAL | 3 refills | Status: DC

## 2021-01-18 MED ORDER — NORETHINDRONE 0.35 MG PO TABS: 1.0000 | ORAL_TABLET | Freq: Every day | ORAL | 3 refills | Status: AC

## 2021-01-18 NOTE — Telephone Encounter (Signed)
Patient called and left message on triage vm. Unable to reach patient. Left voicemail for patient to return call to office.

## 2021-01-18 NOTE — Addendum Note (Signed)
Addended by: Dalphine Handing on: 01/18/2021 09:24 AM   Modules accepted: Orders

## 2021-04-10 ENCOUNTER — Emergency Department (HOSPITAL_BASED_OUTPATIENT_CLINIC_OR_DEPARTMENT_OTHER)
Admission: EM | Admit: 2021-04-10 | Discharge: 2021-04-10 | Disposition: A | Discharge status: Home / Self Care | Payer: Self-pay | Attending: Emergency Medicine | Admitting: Emergency Medicine

## 2021-04-10 ENCOUNTER — Emergency Department (HOSPITAL_BASED_OUTPATIENT_CLINIC_OR_DEPARTMENT_OTHER): Payer: Self-pay

## 2021-04-10 ENCOUNTER — Other Ambulatory Visit: Payer: Self-pay

## 2021-04-10 ENCOUNTER — Encounter (HOSPITAL_BASED_OUTPATIENT_CLINIC_OR_DEPARTMENT_OTHER): Payer: Self-pay | Admitting: *Deleted

## 2021-04-10 ENCOUNTER — Telehealth (HOSPITAL_BASED_OUTPATIENT_CLINIC_OR_DEPARTMENT_OTHER): Payer: Self-pay | Admitting: Emergency Medicine

## 2021-04-10 DIAGNOSIS — I10 Essential (primary) hypertension: Secondary | ICD-10-CM | POA: Insufficient documentation

## 2021-04-10 DIAGNOSIS — R6883 Chills (without fever): Secondary | ICD-10-CM

## 2021-04-10 DIAGNOSIS — R059 Cough, unspecified: Secondary | ICD-10-CM

## 2021-04-10 DIAGNOSIS — J45909 Unspecified asthma, uncomplicated: Secondary | ICD-10-CM | POA: Insufficient documentation

## 2021-04-10 DIAGNOSIS — J208 Acute bronchitis due to other specified organisms: Secondary | ICD-10-CM

## 2021-04-10 DIAGNOSIS — R079 Chest pain, unspecified: Secondary | ICD-10-CM

## 2021-04-10 DIAGNOSIS — E039 Hypothyroidism, unspecified: Secondary | ICD-10-CM | POA: Insufficient documentation

## 2021-04-10 DIAGNOSIS — Z87891 Personal history of nicotine dependence: Secondary | ICD-10-CM | POA: Insufficient documentation

## 2021-04-10 DIAGNOSIS — B349 Viral infection, unspecified: Secondary | ICD-10-CM | POA: Insufficient documentation

## 2021-04-10 DIAGNOSIS — U071 COVID-19: Secondary | ICD-10-CM | POA: Insufficient documentation

## 2021-04-10 LAB — URINALYSIS, ROUTINE W REFLEX MICROSCOPIC
Bilirubin Urine: NEGATIVE
Glucose, UA: NEGATIVE mg/dL
Hgb urine dipstick: NEGATIVE
Ketones, ur: NEGATIVE mg/dL
Leukocytes,Ua: NEGATIVE
Nitrite: NEGATIVE
Protein, ur: 30 mg/dL — AB
Specific Gravity, Urine: 1.018 (ref 1.005–1.030)
pH: 6.5 (ref 5.0–8.0)

## 2021-04-10 LAB — BASIC METABOLIC PANEL
Anion gap: 11 (ref 5–15)
BUN: 7 mg/dL (ref 6–20)
CO2: 22 mmol/L (ref 22–32)
Calcium: 8.3 mg/dL — ABNORMAL LOW (ref 8.9–10.3)
Chloride: 105 mmol/L (ref 98–111)
Creatinine, Ser: 0.69 mg/dL (ref 0.44–1.00)
GFR, Estimated: >60 mL/min (ref >60)
Glucose, Bld: 93 mg/dL (ref 70–99)
Potassium: 3.7 mmol/L (ref 3.5–5.1)
Sodium: 138 mmol/L (ref 135–145)

## 2021-04-10 LAB — CBC
HCT: 34.8 % — ABNORMAL LOW (ref 36.0–46.0)
Hemoglobin: 12.4 g/dL (ref 12.0–15.0)
MCH: 30.3 pg (ref 26.0–34.0)
MCHC: 35.6 g/dL (ref 30.0–36.0)
MCV: 85.1 fL (ref 80.0–100.0)
Platelets: 201 10*3/uL (ref 150–400)
RBC: 4.09 MIL/uL (ref 3.87–5.11)
RDW: 13.4 % (ref 11.5–15.5)
WBC: 5.6 10*3/uL (ref 4.0–10.5)
nRBC: 0 % (ref 0.0–0.2)

## 2021-04-10 LAB — PREGNANCY, URINE: Preg Test, Ur: NEGATIVE

## 2021-04-10 LAB — RESP PANEL BY RT-PCR (FLU A&B, COVID) ARPGX2
Influenza A by PCR: NEGATIVE
Influenza B by PCR: NEGATIVE
SARS Coronavirus 2 by RT PCR: POSITIVE — AB

## 2021-04-10 LAB — GROUP A STREP BY PCR: Group A Strep by PCR: NOT DETECTED

## 2021-04-10 LAB — TROPONIN I (HIGH SENSITIVITY): Troponin I (High Sensitivity): 2 ng/L (ref <18)

## 2021-04-10 MED ORDER — ONDANSETRON HCL 4 MG PO TABS: 4.0000 mg | ORAL_TABLET | Freq: Four times a day (QID) | ORAL | 0 refills | Status: AC

## 2021-04-10 MED ORDER — CYCLOBENZAPRINE HCL 10 MG PO TABS: 10.0000 mg | ORAL_TABLET | Freq: Two times a day (BID) | ORAL | 0 refills | Status: AC | PRN

## 2021-04-10 MED ORDER — KETOROLAC TROMETHAMINE 30 MG/ML IJ SOLN
30.0000 mg | Freq: Once | INTRAMUSCULAR | Status: AC
  Administered 2021-04-10: 30 mg via INTRAVENOUS
  Filled 2021-04-10: qty 1

## 2021-04-10 MED ORDER — NIRMATRELVIR/RITONAVIR (PAXLOVID)TABLET: 3.0000 | ORAL_TABLET | Freq: Two times a day (BID) | ORAL | 0 refills | Status: AC

## 2021-04-10 NOTE — ED Triage Notes (Signed)
Woke up yesterday morning with chest pain (pointing to her epigastric area), chills, mid back pain, coughing, nasal congestion and nasal drainage.

## 2021-04-10 NOTE — ED Provider Notes (Signed)
[facility] EMERGENCY DEPT Provider Note   CSN: [id number] Arrival date & time: 04/10/21  0734     History Chief Complaint  Patient presents with  . Cough  . Back Pain  . Chest Pain    Patricia Arnold is a 45 y.o. female.  HPI      45 year old female with a history of asthma, hypertension, hypothyroidism, sickle cell trait, presents with concern for chest pain, chills, mid back pain, coughing and congestion.  Reports that she is feeling terrible.  Chills, body aches, sore throat, cough, chest pain and back pain.  Decreased appetite, fatigue.  No known COVID-19 contacts.  She is vaccinated but not boosted.  Reports the chest pain is an ache, constant, approximately 5 out of 10, worse with cough.  Reports having overall body aches.  She is coughing up green mucus. Reports her symptoms rapidly worsened since yesterday. No dyspnea or rash.   Past Medical History:  Diagnosis Date  . Anemia   . Anxiety   . Arthritis   . Asthma   . Bipolar depression (HCC)   . Depression   . Headache(784.0)   . Hypertension   . Thyroid disease    Hyperthyroid  . Uveitis     Patient Active Problem List   Diagnosis Date Noted  . Primary osteoarthritis of both hands 08/23/2017  . Primary osteoarthritis of both feet 04/29/2017  . Avascular necrosis left knee 03/12/2017  . Polyarthralgia 02/18/2017  . Uveitis 02/18/2017  . Status post reconstruction of ligament of knee joint 02/18/2017  . Sickle cell trait (HCC) 02/18/2017  . History of asthma 02/18/2017  . History of migraine 02/18/2017  . Smoker 02/18/2017  . Vitamin D deficiency 02/18/2017  . Primary osteoarthritis of both knees 02/18/2017  . Adjustment disorder with depressed mood 08/11/2014  . Nausea/vomiting in pregnancy 06/21/2012  . Bacterial vaginosis 06/21/2012  . Viral gastroenteritis 06/21/2012    Past Surgical History:  Procedure Laterality Date  . ELBOW SURGERY    . FRACTURE SURGERY    .  reconstrictive       OB History    Gravida  3   Para  1   Term  1   Preterm  0   AB  2   Living  1     SAB  1   IAB  0   Ectopic  0   Multiple  0   Live Births              Family History  Problem Relation Age of Onset  . Bipolar disorder Mother   . Arthritis Mother   . Depression Mother   . Varicose Veins Mother   . Arthritis Sister   . Diabetes Maternal Grandmother   . Other Neg Hx     Social History   Tobacco Use  . Smoking status: Former Smoker    Packs/day: 0.50    Types: Cigarettes    Quit date: 07/18/2020    Years since quitting: 0.7  . Smokeless tobacco: Never Used  Vaping Use  . Vaping Use: Never used  Substance Use Topics  . Alcohol use: Yes    Comment: occ  . Drug use: No    Home Medications Prior to Admission medications   Medication Sig Start Date End Date Taking? Authorizing Provider  norethindrone (MICRONOR) 0.35 MG tablet Take 1 tablet (0.35 mg total) by mouth daily. 01/18/21  Yes Adam Phenix, MD    Allergies    Candiss Norse [  memantine hcl], Penicillins, Sulfa antibiotics, Tizanidine, and Tramadol  Review of Systems   Review of Systems  Constitutional: Positive for chills. Negative for fever.  HENT: Positive for congestion, postnasal drip and rhinorrhea. Negative for sore throat.   Eyes: Negative for visual disturbance.  Respiratory: Positive for cough. Negative for shortness of breath.   Cardiovascular: Negative for chest pain.  Gastrointestinal: Negative for abdominal pain, nausea and vomiting.  Genitourinary: Negative for difficulty urinating.  Musculoskeletal: Positive for back pain. Negative for neck pain.  Skin: Negative for rash.  Neurological: Negative for syncope and headaches.    Physical Exam Updated Vital Signs BP 98/74 (BP Location: Right Arm)   Pulse 90   Temp 98.4 F (36.9 C) (Oral)   Resp 15   Ht 5\' 2"  (1.575 m)   Wt 54.9 kg   LMP 03/23/2021   SpO2 98%   BMI 22.13 kg/m   Physical Exam Vitals  and nursing note reviewed.  Constitutional:      General: She is not in acute distress.    Appearance: She is well-developed. She is not diaphoretic.  HENT:     Head: Normocephalic and atraumatic.  Eyes:     Conjunctiva/sclera: Conjunctivae normal.  Cardiovascular:     Rate and Rhythm: Normal rate and regular rhythm.     Heart sounds: Normal heart sounds. No murmur heard. No friction rub. No gallop.   Pulmonary:     Effort: Pulmonary effort is normal. No respiratory distress.     Breath sounds: Normal breath sounds. No wheezing or rales.  Chest:     Chest wall: Tenderness present.  Abdominal:     General: There is no distension.     Palpations: Abdomen is soft.     Tenderness: There is no abdominal tenderness. There is no guarding.  Musculoskeletal:        General: No tenderness.     Cervical back: Normal range of motion.  Skin:    General: Skin is warm and dry.     Findings: No erythema or rash.  Neurological:     Mental Status: She is alert and oriented to person, place, and time.     ED Results / Procedures / Treatments   Labs (all labs ordered are listed, but only abnormal results are displayed) Labs Reviewed  BASIC METABOLIC PANEL - Abnormal; Notable for the following components:      Result Value   Calcium 8.3 (*)    All other components within normal limits  CBC - Abnormal; Notable for the following components:   HCT 34.8 (*)    All other components within normal limits  GROUP A STREP BY PCR  PREGNANCY, URINE  URINALYSIS, ROUTINE W REFLEX MICROSCOPIC  TROPONIN I (HIGH SENSITIVITY)    EKG EKG Interpretation  Date/Time:  Wednesday April 10 2021 07:45:05 EDT Ventricular Rate:  82 PR Interval:  157 QRS Duration: 76 QT Interval:  342 QTC Calculation: 400 R Axis:   71 Text Interpretation: Sinus rhythm Early repolarization No significant change since last tracing Confirmed by [id number] (Alvira Monday) on 04/10/2021 8:09:28 AM   Radiology No results  found.  Procedures Procedures   Medications Ordered in ED Medications - No data to display  ED Course  I have reviewed the triage vital signs and the nursing notes.  Pertinent labs & imaging results that were available during my care of the patient were reviewed by me and considered in my medical decision making (see chart for details).  MDM Rules/Calculators/A&P                          45 year old female with a history of asthma, hypertension, hypothyroidism, sickle cell trait, presents with concern for chest pain, chills, mid back pain, coughing and congestion.  EKG evaluated by me and similar to prior with early repolarization.  Chest XR shows no sign of pneumonia or pneumothorax, is consistent with bronchitis. Troponin negative and have low suspicion for ACS.  No sign of RPA/epiglottitis/PTA.  UA negative for infection.  No clinically significant anemia or electrolyte abnormality.   Suspect likely viral syndrome such as COVID 19. Testing sent and rx for paxlovid given asthma hx to take if she tests positive.  Normal oxygenation, appropriate for outpatient follow up.  Recommend continued supportive care.   Strep and COVID/flu pending at time of discharge, will call in appropriate rx if positive.   Final Clinical Impression(s) / ED Diagnoses Final diagnoses:  Cough  Chills  Viral syndrome  Chest pain, unspecified type    Rx / DC Orders ED Discharge Orders    None       Alvira Monday, MD 04/10/21 [phone]

## 2021-12-02 ENCOUNTER — Telehealth: Payer: Medicaid Other | Admitting: Physician Assistant

## 2021-12-02 DIAGNOSIS — U071 COVID-19: Secondary | ICD-10-CM

## 2021-12-02 MED ORDER — NIRMATRELVIR/RITONAVIR (PAXLOVID)TABLET: 3.0000 | ORAL_TABLET | Freq: Two times a day (BID) | ORAL | 0 refills | Status: AC

## 2021-12-02 NOTE — Patient Instructions (Signed)
Patricia Arnold, thank you for joining Mar Daring, PA-C for today's virtual visit.  While this provider is not your primary care provider (PCP), if your PCP is located in our provider database this encounter information will be shared with them immediately following your visit.  Consent: (Patient) Patricia Arnold provided verbal consent for this virtual visit at the beginning of the encounter.  Current Medications:  Current Outpatient Medications:    nirmatrelvir/ritonavir EUA (PAXLOVID) 20 x 150 MG & 10 x 100MG  TABS, Take 3 tablets by mouth 2 (two) times daily for 5 days. (Take nirmatrelvir 150 mg two tablets twice daily for 5 days and ritonavir 100 mg one tablet twice daily for 5 days) Patient GFR is greater than 60, Disp: 30 tablet, Rfl: 0   cyclobenzaprine (FLEXERIL) 10 MG tablet, Take 1 tablet (10 mg total) by mouth 2 (two) times daily as needed for muscle spasms., Disp: 20 tablet, Rfl: 0   norethindrone (MICRONOR) 0.35 MG tablet, Take 1 tablet (0.35 mg total) by mouth daily., Disp: 84 tablet, Rfl: 3   ondansetron (ZOFRAN) 4 MG tablet, Take 1 tablet (4 mg total) by mouth every 6 (six) hours., Disp: 12 tablet, Rfl: 0   Medications ordered in this encounter:  Meds ordered this encounter  Medications   nirmatrelvir/ritonavir EUA (PAXLOVID) 20 x 150 MG & 10 x 100MG  TABS    Sig: Take 3 tablets by mouth 2 (two) times daily for 5 days. (Take nirmatrelvir 150 mg two tablets twice daily for 5 days and ritonavir 100 mg one tablet twice daily for 5 days) Patient GFR is greater than 60    Dispense:  30 tablet    Refill:  0    Order Specific Question:   Supervising Provider    Answer:   Noemi Chapel [[id number]]     *If you need refills on other medications prior to your next appointment, please contact your pharmacy*  Follow-Up: Call back or seek an in-person evaluation if the symptoms worsen or if the condition fails to improve as anticipated.  Other Instructions Paxlovid-  Nirmatrelvir; Ritonavir Tablets What is this medication? NIRMATRELVIR; RITONAVIR (NIR ma TREL vir; ri TOE na veer) treats mild to moderate COVID-19. It may help people who are at high risk of developing severe illness. This medication works by limiting the spread of the virus in your body. The FDA has allowed the emergency use of this medication. This medicine may be used for other purposes; ask your health care provider or pharmacist if you have questions. COMMON BRAND NAME(S): PAXLOVID What should I tell my care team before I take this medication? They need to know if you have any of these conditions: Any allergies Any serious illness Kidney disease Liver disease An unusual or allergic reaction to nirmatrelvir, ritonavir, other medications, foods, dyes, or preservatives Pregnant or trying to get pregnant Breast-feeding How should I use this medication? This product contains 2 different medications that are packaged together. For the standard dose, take 2 pink tablets of nirmatrelvir with 1 white tablet of ritonavir (3 tablets total) by mouth with water twice daily. Talk to your care team if you have kidney disease. You may need a different dose. Swallow the tablets whole. You can take it with or without food. If it upsets your stomach, take it with food. Take all of this medication unless your care team tells you to stop it early. Keep taking it even if you think you are better. Talk to your care team  about the use of this medication in children. While it may be prescribed for children as young as 12 years for selected conditions, precautions do apply. Overdosage: If you think you have taken too much of this medicine contact a poison control center or emergency room at once. NOTE: This medicine is only for you. Do not share this medicine with others. What if I miss a dose? If you miss a dose, take it as soon as you can unless it is more than 8 hours late. If it is more than 8 hours late, skip  the missed dose. Take the next dose at the normal time. Do not take extra or 2 doses at the same time to make up for the missed dose. What may interact with this medication? Do not take this medication with any of the following medications: Alfuzosin Certain medications for anxiety or sleep like midazolam, triazolam Certain medications for cancer like apalutamide, enzalutamide Certain medications for cholesterol like lovastatin, simvastatin Certain medications for irregular heart beat like amiodarone, dronedarone, flecainide, propafenone, quinidine Certain medications for pain like meperidine, piroxicam Certain medications for psychotic disorders like clozapine, lurasidone, pimozide Certain medications for seizures like carbamazepine, phenobarbital, phenytoin Colchicine Eletriptan Eplerenone Ergot alkaloids like dihydroergotamine, ergonovine, ergotamine, methylergonovine Finerenone Flibanserin Ivabradine Lomitapide Naloxegol Ranolazine Rifampin Sildenafil Silodosin St. John's Wort Tolvaptan Ubrogepant Voclosporin This medication may also interact with the following medications: Bedaquiline Birth control pills Bosentan Certain antibiotics like erythromycin or clarithromycin Certain medications for blood pressure like amlodipine, diltiazem, felodipine, nicardipine, nifedipine Certain medications for cancer like abemaciclib, ceritinib, dasatinib, encorafenib, ibrutinib, ivosidenib, neratinib, nilotinib, venetoclax, vinblastine, vincristine Certain medications for cholesterol like atorvastatin, rosuvastatin Certain medications for depression like bupropion, trazodone Certain medications for fungal infections like isavuconazonium, itraconazole, ketoconazole, voriconazole Certain medications for hepatitis C like elbasvir; grazoprevir, dasabuvir; ombitasvir; paritaprevir; ritonavir, glecaprevir; pibrentasvir, sofosbuvir; velpatasvir; voxilaprevir Certain medications for HIV or  AIDS Certain medications for irregular heartbeat like lidocaine Certain medications that treat or prevent blood clots like rivaroxaban, warfarin Digoxin Fentanyl Medications that lower your chance of fighting infection like cyclosporine, sirolimus, tacrolimus Methadone Quetiapine Rifabutin Salmeterol Steroid medications like betamethasone, budesonide, ciclesonide, dexamethasone, fluticasone, methylprednisone, mometasone, triamcinolone This list may not describe all possible interactions. Give your health care provider a list of all the medicines, herbs, non-prescription drugs, or dietary supplements you use. Also tell them if you smoke, drink alcohol, or use illegal drugs. Some items may interact with your medicine. What should I watch for while using this medication? Your condition will be monitored carefully while you are receiving this medication. Visit your care team for regular checkups. Tell your care team if your symptoms do not start to get better or if they get worse. If you have untreated HIV infection, this medication may lead to some HIV medications not working as well in the future. Birth control may not work properly while you are taking this medication. Talk to your care team about using an extra method of birth control. What side effects may I notice from receiving this medication? Side effects that you should report to your care team as soon as possible: Allergic reactions--skin rash, itching, hives, swelling of the face, lips, tongue, or throat Liver injury--right upper belly pain, loss of appetite, nausea, light-colored stool, dark yellow or brown urine, yellowing skin or eyes, unusual weakness or fatigue Redness, blistering, peeling, or loosening of the skin, including inside the mouth Side effects that usually do not require medical attention (report these to your care team if they continue  or are bothersome): Change in taste Diarrhea General discomfort and  fatigue Increase in blood pressure Muscle pain Nausea Stomach pain This list may not describe all possible side effects. Call your doctor for medical advice about side effects. You may report side effects to FDA at 1-800-FDA-1088. Where should I keep my medication? Keep out of the reach of children and pets. Store at room temperature between 20 and 25 degrees C (68 and 77 degrees F). Get rid of any unused medication after the expiration date. To get rid of medications that are no longer needed or have expired: Take the medication to a medication take-back program. Check with your pharmacy or law enforcement to find a location. If you cannot return the medication, check the label or package insert to see if the medication should be thrown out in the garbage or flushed down the toilet. If you are not sure, ask your care team. If it is safe to put it in the trash, take the medication out of the container. Mix the medication with cat litter, dirt, coffee grounds, or other unwanted substance. Seal the mixture in a bag or container. Put it in the trash. NOTE: This sheet is a summary. It may not cover all possible information. If you have questions about this medicine, talk to your doctor, pharmacist, or health care provider.  2022 Elsevier/Gold Standard (2021-07-22 00:00:00)   COVID-19: Quarantine and Isolation Quarantine If you were exposed Quarantine and stay away from others when you have been in close contact with someone who has COVID-19. Isolate If you are sick or test positive Isolate when you are sick or when you have COVID-19, even if you don't have symptoms. When to stay home Calculating quarantine The date of your exposure is considered day 0. Day 1 is the first full day after your last contact with a person who has had COVID-19. Stay home and away from other people for at least 5 days. Learn why CDC updated guidance for the general public. IF YOU were exposed to COVID-19 and are NOT   up to dateIF YOU were exposed to COVID-19 and are NOT on COVID-19 vaccinations Quarantine for at least 5 days Stay home Stay home and quarantine for at least 5 full days. Wear a well-fitting mask if you must be around others in your home. Do not travel. Get tested Even if you don't develop symptoms, get tested at least 5 days after you last had close contact with someone with COVID-19. After quarantine Watch for symptoms Watch for symptoms until 10 days after you last had close contact with someone with COVID-19. Avoid travel It is best to avoid travel until a full 10 days after you last had close contact with someone with COVID-19. If you develop symptoms Isolate immediately and get tested. Continue to stay home until you know the results. Wear a well-fitting mask around others. Take precautions until day 10 Wear a well-fitting mask Wear a well-fitting mask for 10 full days any time you are around others inside your home or in public. Do not go to places where you are unable to wear a well-fitting mask. If you must travel during days 6-10, take precautions. Avoid being around people who are more likely to get very sick from COVID-19. IF YOU were exposed to COVID-19 and are  up to dateIF YOU were exposed to COVID-19 and are on COVID-19 vaccinations No quarantine You do not need to stay home unless you develop symptoms. Get tested Even if you  don't develop symptoms, get tested at least 5 days after you last had close contact with someone with COVID-19. Watch for symptoms Watch for symptoms until 10 days after you last had close contact with someone with COVID-19. If you develop symptoms Isolate immediately and get tested. Continue to stay home until you know the results. Wear a well-fitting mask around others. Take precautions until day 10 Wear a well-fitting mask Wear a well-fitting mask for 10 full days any time you are around others inside your home or in public. Do not go to  places where you are unable to wear a well-fitting mask. Take precautions if traveling Avoid being around people who are more likely to get very sick from COVID-19. IF YOU were exposed to COVID-19 and had confirmed COVID-19 within the past 90 days (you tested positive using a viral test) No quarantine You do not need to stay home unless you develop symptoms. Watch for symptoms Watch for symptoms until 10 days after you last had close contact with someone with COVID-19. If you develop symptoms Isolate immediately and get tested. Continue to stay home until you know the results. Wear a well-fitting mask around others. Take precautions until day 10 Wear a well-fitting mask Wear a well-fitting mask for 10 full days any time you are around others inside your home or in public. Do not go to places where you are unable to wear a well-fitting mask. Take precautions if traveling Avoid being around people who are more likely to get very sick from COVID-19. Calculating isolation Day 0 is your first day of symptoms or a positive viral test. Day 1 is the first full day after your symptoms developed or your test specimen was collected. If you have COVID-19 or have symptoms, isolate for at least 5 days. IF YOU tested positive for COVID-19 or have symptoms, regardless of vaccination status Stay home for at least 5 days Stay home for 5 days and isolate from others in your home. Wear a well-fitting mask if you must be around others in your home. Do not travel. Ending isolation if you had symptoms End isolation after 5 full days if you are fever-free for 24 hours (without the use of fever-reducing medication) and your symptoms are improving. Ending isolation if you did NOT have symptoms End isolation after at least 5 full days after your positive test. If you got very sick from COVID-19 or have a weakened immune system You should isolate for at least 10 days. Consult your doctor before ending  isolation. Take precautions until day 10 Wear a well-fitting mask Wear a well-fitting mask for 10 full days any time you are around others inside your home or in public. Do not go to places where you are unable to wear a well-fitting mask. Do not travel Do not travel until a full 10 days after your symptoms started or the date your positive test was taken if you had no symptoms. Avoid being around people who are more likely to get very sick from COVID-19. Definitions Exposure Contact with someone infected with SARS-CoV-2, the virus that causes COVID-19, in a way that increases the likelihood of getting infected with the virus. Close contact A close contact is someone who was less than 6 feet away from an infected person (laboratory-confirmed or a clinical diagnosis) for a cumulative total of 15 minutes or more over a 24-hour period. For example, three individual 5-minute exposures for a total of 15 minutes. People who are exposed to someone with  COVID-19 after they completed at least 5 days of isolation are not considered close contacts. Julio Sicks is a strategy used to prevent transmission of COVID-19 by keeping people who have been in close contact with someone with COVID-19 apart from others. Who does not need to quarantine? If you had close contact with someone with COVID-19 and you are in one of the following groups, you do not need to quarantine. You are up to date with your COVID-19 vaccines. You had confirmed COVID-19 within the last 90 days (meaning you tested positive using a viral test). If you are up to date with COVID-19 vaccines, you should wear a well-fitting mask around others for 10 days from the date of your last close contact with someone with COVID-19 (the date of last close contact is considered day 0). Get tested at least 5 days after you last had close contact with someone with COVID-19. If you test positive or develop COVID-19 symptoms, isolate from other people  and follow recommendations in the Isolation section below. If you tested positive for COVID-19 with a viral test within the previous 90 days and subsequently recovered and remain without COVID-19 symptoms, you do not need to quarantine or get tested after close contact. You should wear a well-fitting mask around others for 10 days from the date of your last close contact with someone with COVID-19 (the date of last close contact is considered day 0). If you have COVID-19 symptoms, get tested and isolate from other people and follow recommendations in the Isolation section below. Who should quarantine? If you come into close contact with someone with COVID-19, you should quarantine if you are not up to date on COVID-19 vaccines. This includes people who are not vaccinated. What to do for quarantine Stay home and away from other people for at least 5 days (day 0 through day 5) after your last contact with a person who has COVID-19. The date of your exposure is considered day 0. Wear a well-fitting mask when around others at home, if possible. For 10 days after your last close contact with someone with COVID-19, watch for fever (100.30F or greater), cough, shortness of breath, or other COVID-19 symptoms. If you develop symptoms, get tested immediately and isolate until you receive your test results. If you test positive, follow isolation recommendations. If you do not develop symptoms, get tested at least 5 days after you last had close contact with someone with COVID-19. If you test negative, you can leave your home, but continue to wear a well-fitting mask when around others at home and in public until 10 days after your last close contact with someone with COVID-19. If you test positive, you should isolate for at least 5 days from the date of your positive test (if you do not have symptoms). If you do develop COVID-19 symptoms, isolate for at least 5 days from the date your symptoms began (the date the  symptoms started is day 0). Follow recommendations in the isolation section below. If you are unable to get a test 5 days after last close contact with someone with COVID-19, you can leave your home after day 5 if you have been without COVID-19 symptoms throughout the 5-day period. Wear a well-fitting mask for 10 days after your date of last close contact when around others at home and in public. Avoid people who are have weakened immune systems or are more likely to get very sick from COVID-19, and nursing homes and other high-risk settings, until  after at least 10 days. If possible, stay away from people you live with, especially people who are at higher risk for getting very sick from COVID-19, as well as others outside your home throughout the full 10 days after your last close contact with someone with COVID-19. If you are unable to quarantine, you should wear a well-fitting mask for 10 days when around others at home and in public. If you are unable to wear a mask when around others, you should continue to quarantine for 10 days. Avoid people who have weakened immune systems or are more likely to get very sick from COVID-19, and nursing homes and other high-risk settings, until after at least 10 days. See additional information about travel. Do not go to places where you are unable to wear a mask, such as restaurants and some gyms, and avoid eating around others at home and at work until after 10 days after your last close contact with someone with COVID-19. After quarantine Watch for symptoms until 10 days after your last close contact with someone with COVID-19. If you have symptoms, isolate immediately and get tested. Quarantine in high-risk congregate settings In certain congregate settings that have high risk of secondary transmission (such as Systems analyst and detention facilities, homeless shelters, or cruise ships), CDC recommends a 10-day quarantine for residents, regardless of vaccination  and booster status. During periods of critical staffing shortages, facilities may consider shortening the quarantine period for staff to ensure continuity of operations. Decisions to shorten quarantine in these settings should be made in consultation with state, local, tribal, or territorial health departments and should take into consideration the context and characteristics of the facility. CDC's setting-specific guidance provides additional recommendations for these settings. Isolation Isolation is used to separate people with confirmed or suspected COVID-19 from those without COVID-19. People who are in isolation should stay home until it's safe for them to be around others. At home, anyone sick or infected should separate from others, or wear a well-fitting mask when they need to be around others. People in isolation should stay in a specific "sick room" or area and use a separate bathroom if available. Everyone who has presumed or confirmed COVID-19 should stay home and isolate from other people for at least 5 full days (day 0 is the first day of symptoms or the date of the day of the positive viral test for asymptomatic persons). They should wear a mask when around others at home and in public for an additional 5 days. People who are confirmed to have COVID-19 or are showing symptoms of COVID-19 need to isolate regardless of their vaccination status. This includes: People who have a positive viral test for COVID-19, regardless of whether or not they have symptoms. People with symptoms of COVID-19, including people who are awaiting test results or have not been tested. People with symptoms should isolate even if they do not know if they have been in close contact with someone with COVID-19. What to do for isolation Monitor your symptoms. If you have an emergency warning sign (including trouble breathing), seek emergency medical care immediately. Stay in a separate room from other household members, if  possible. Use a separate bathroom, if possible. Take steps to improve ventilation at home, if possible. Avoid contact with other members of the household and pets. Don't share personal household items, like cups, towels, and utensils. Wear a well-fitting mask when you need to be around other people. Learn more about what to do if you are  sick and how to notify your contacts. Ending isolation for people who had COVID-19 and had symptoms If you had COVID-19 and had symptoms, isolate for at least 5 days. To calculate your 5-day isolation period, day 0 is your first day of symptoms. Day 1 is the first full day after your symptoms developed. You can leave isolation after 5 full days. You can end isolation after 5 full days if you are fever-free for 24 hours without the use of fever-reducing medication and your other symptoms have improved (Loss of taste and smell may persist for weeks or months after recovery and need not delay the end of isolation). You should continue to wear a well-fitting mask around others at home and in public for 5 additional days (day 6 through day 10) after the end of your 5-day isolation period. If you are unable to wear a mask when around others, you should continue to isolate for a full 10 days. Avoid people who have weakened immune systems or are more likely to get very sick from COVID-19, and nursing homes and other high-risk settings, until after at least 10 days. If you continue to have fever or your other symptoms have not improved after 5 days of isolation, you should wait to end your isolation until you are fever-free for 24 hours without the use of fever-reducing medication and your other symptoms have improved. Continue to wear a well-fitting mask through day 10. Contact your healthcare provider if you have questions. See additional information about travel. Do not go to places where you are unable to wear a mask, such as restaurants and some gyms, and avoid eating  around others at home and at work until a full 10 days after your first day of symptoms. If an individual has access to a test and wants to test, the best approach is to use an antigen test1 towards the end of the 5-day isolation period. Collect the test sample only if you are fever-free for 24 hours without the use of fever-reducing medication and your other symptoms have improved (loss of taste and smell may persist for weeks or months after recovery and need not delay the end of isolation). If your test result is positive, you should continue to isolate until day 10. If your test result is negative, you can end isolation, but continue to wear a well-fitting mask around others at home and in public until day 10. Follow additional recommendations for masking and avoiding travel as described above. 1As noted in the labeling for authorized over-the counter antigen tests: Negative results should be treated as presumptive. Negative results do not rule out SARS-CoV-2 infection and should not be used as the sole basis for treatment or patient management decisions, including infection control decisions. To improve results, antigen tests should be used twice over a three-day period with at least 24 hours and no more than 48 hours between tests. Note that these recommendations on ending isolation do not apply to people who are moderately ill or very sick from COVID-19 or have weakened immune systems. See section below for recommendations for when to end isolation for these groups. Ending isolation for people who tested positive for COVID-19 but had no symptoms If you test positive for COVID-19 and never develop symptoms, isolate for at least 5 days. Day 0 is the day of your positive viral test (based on the date you were tested) and day 1 is the first full day after the specimen was collected for your positive test.  You can leave isolation after 5 full days. If you continue to have no symptoms, you can end isolation  after at least 5 days. You should continue to wear a well-fitting mask around others at home and in public until day 10 (day 6 through day 10). If you are unable to wear a mask when around others, you should continue to isolate for 10 days. Avoid people who have weakened immune systems or are more likely to get very sick from COVID-19, and nursing homes and other high-risk settings, until after at least 10 days. If you develop symptoms after testing positive, your 5-day isolation period should start over. Day 0 is your first day of symptoms. Follow the recommendations above for ending isolation for people who had COVID-19 and had symptoms. See additional information about travel. Do not go to places where you are unable to wear a mask, such as restaurants and some gyms, and avoid eating around others at home and at work until 10 days after the day of your positive test. If an individual has access to a test and wants to test, the best approach is to use an antigen test1 towards the end of the 5-day isolation period. If your test result is positive, you should continue to isolate until day 10. If your test result is positive, you can also choose to test daily and if your test result is negative, you can end isolation, but continue to wear a well-fitting mask around others at home and in public until day 10. Follow additional recommendations for masking and avoiding travel as described above. 1As noted in the labeling for authorized over-the counter antigen tests: Negative results should be treated as presumptive. Negative results do not rule out SARS-CoV-2 infection and should not be used as the sole basis for treatment or patient management decisions, including infection control decisions. To improve results, antigen tests should be used twice over a three-day period with at least 24 hours and no more than 48 hours between tests. Ending isolation for people who were moderately or very sick from COVID-19 or  have a weakened immune system People who are moderately ill from COVID-19 (experiencing symptoms that affect the lungs like shortness of breath or difficulty breathing) should isolate for 10 days and follow all other isolation precautions. To calculate your 10-day isolation period, day 0 is your first day of symptoms. Day 1 is the first full day after your symptoms developed. If you are unsure if your symptoms are moderate, talk to a healthcare provider for further guidance. People who are very sick from COVID-19 (this means people who were hospitalized or required intensive care or ventilation support) and people who have weakened immune systems might need to isolate at home longer. They may also require testing with a viral test to determine when they can be around others. CDC recommends an isolation period of at least 10 and up to 20 days for people who were very sick from COVID-19 and for people with weakened immune systems. Consult with your healthcare provider about when you can resume being around other people. If you are unsure if your symptoms are severe or if you have a weakened immune system, talk to a healthcare provider for further guidance. People who have a weakened immune system should talk to their healthcare provider about the potential for reduced immune responses to COVID-19 vaccines and the need to continue to follow current prevention measures (including wearing a well-fitting mask and avoiding crowds and poorly ventilated indoor  spaces) to protect themselves against COVID-19 until advised otherwise by their healthcare provider. Close contacts of immunocompromised people--including household members--should also be encouraged to receive all recommended COVID-19 vaccine doses to help protect these people. Isolation in high-risk congregate settings In certain high-risk congregate settings that have high risk of secondary transmission and where it is not feasible to cohort people (such as  Systems analyst and detention facilities, homeless shelters, and cruise ships), CDC recommends a 10-day isolation period for residents. During periods of critical staffing shortages, facilities may consider shortening the isolation period for staff to ensure continuity of operations. Decisions to shorten isolation in these settings should be made in consultation with state, local, tribal, or territorial health departments and should take into consideration the context and characteristics of the facility. CDC's setting-specific guidance provides additional recommendations for these settings. This CDC guidance is meant to supplement--not replace--any federal, state, local, territorial, or tribal health and safety laws, rules, and regulations. Recommendations for specific settings These recommendations do not apply to healthcare professionals. For guidance specific to these settings, see Healthcare professionals: Interim Guidance for Optician, dispensing with SARS-CoV-2 Infection or Exposure to SARS-CoV-2 Patients, residents, and visitors to healthcare settings: Interim Infection Prevention and Control Recommendations for Healthcare Personnel During the [place] 2019 (COVID-19) Pandemic Additional setting-specific guidance and recommendations are available. These recommendations on quarantine and isolation do apply to [facility] [facility] settings. Additional guidance is available here: Overview of COVID-19 Quarantine for K-12 Schools Travelers: Travel information and recommendations Congregate facilities and other settings: [employer] for community, work, and school settings Ongoing COVID-19 exposure FAQs I live with someone with COVID-19, but I cannot be separated from them. How do we manage quarantine in this situation? It is very important for people with COVID-19 to remain apart from other people, if possible, even if they are living together. If separation of the person with COVID-19 from  others that they live with is not possible, the other people that they live with will have ongoing exposure, meaning they will be repeatedly exposed until that person is no longer able to spread the virus to other people. In this situation, there are precautions you can take to limit the spread of COVID-19: The person with COVID-19 and everyone they live with should wear a well-fitting mask inside the home. If possible, one person should care for the person with COVID-19 to limit the number of people who are in close contact with the infected person. Take steps to protect yourself and others to reduce transmission in the home: Quarantine if you are not up to date with your COVID-19 vaccines. Isolate if you are sick or tested positive for COVID-19, even if you don't have symptoms. Learn more about the public health recommendations for testing, mask use and quarantine of close contacts, like yourself, who have ongoing exposure. These recommendations differ depending on your vaccination status. What should I do if I have ongoing exposure to COVID-19 from someone I live with? Recommendations for this situation depend on your vaccination status: If you are not up to date on COVID-19 vaccines and have ongoing exposure to COVID-19, you should: Begin quarantine immediately and continue to quarantine throughout the isolation period of the person with COVID-19. Continue to quarantine for an additional 5 days starting the day after the end of isolation for the person with COVID-19. Get tested at least 5 days after the end of isolation of the infected person that lives with them. If you test negative, you can leave the  home but should continue to wear a well-fitting mask when around others at home and in public until 10 days after the end of isolation for the person with COVID-19. Isolate immediately if you develop symptoms of COVID-19 or test positive. If you are up to date with COVID-19 vaccines and have  ongoing exposure to COVID-19, you should: Get tested at least 5 days after your first exposure. A person with COVID-19 is considered infectious starting 2 days before they develop symptoms, or 2 days before the date of their positive test if they do not have symptoms. Get tested again at least 5 days after the end of isolation for the person with COVID-19. Wear a well-fitting mask when you are around the person with COVID-19, and do this throughout their isolation period. Wear a well-fitting mask around others for 10 days after the infected person's isolation period ends. Isolate immediately if you develop symptoms of COVID-19 or test positive. What should I do if multiple people I live with test positive for COVID-19 at different times? Recommendations for this situation depend on your vaccination status: If you are not up to date with your COVID-19 vaccines, you should: Quarantine throughout the isolation period of any infected person that you live with. Continue to quarantine until 5 days after the end of isolation date for the most recently infected person that lives with you. For example, if the last day of isolation of the person most recently infected with COVID-19 was June 30, the new 5-day quarantine period starts on July 1. Get tested at least 5 days after the end of isolation for the most recently infected person that lives with you. Wear a well-fitting mask when you are around any person with COVID-19 while that person is in isolation. Wear a well-fitting mask when you are around other people until 10 days after your last close contact. Isolate immediately if you develop symptoms of COVID-19 or test positive. If you are up to date with your COVID-19 vaccines, you should: Get tested at least 5 days after your first exposure. A person with COVID-19 is considered infectious starting 2 days before they developed symptoms, or 2 days before the date of their positive test if they do not have  symptoms. Get tested again at least 5 days after the end of isolation for the most recently infected person that lives with you. Wear a well-fitting mask when you are around any person with COVID-19 while that person is in isolation. Wear a well-fitting mask around others for 10 days after the end of isolation for the most recently infected person that lives with you. For example, if the last day of isolation for the person most recently infected with COVID-19 was June 30, the new 10-day period to wear a well-fitting mask indoors in public starts on July 1. Isolate immediately if you develop symptoms of COVID-19 or test positive. I had COVID-19 and completed isolation. Do I have to quarantine or get tested if someone I live with gets COVID-19 shortly after I completed isolation? No. If you recently completed isolation and someone that lives with you tests positive for the virus that causes COVID-19 shortly after the end of your isolation period, you do not have to quarantine or get tested as long as you do not develop new symptoms. Once all of the people that live together have completed isolation or quarantine, refer to the guidance below for new exposures to COVID-19. If you had COVID-19 in the previous 90 days and  then came into close contact with someone with COVID-19, you do not have to quarantine or get tested if you do not have symptoms. But you should: Wear a well-fitting mask indoors in public for 10 days after your last close contact. Monitor for COVID-19 symptoms for 10 days from the date of your last close contact. Isolate immediately and get tested if symptoms develop. If more than 90 days have passed since your recovery from infection, follow CDC's recommendations for close contacts. These recommendations will differ depending on your vaccination status. 01/30/2021 Content source: [facility] ([facility]), Division of Viral Diseases This  information is not intended to replace advice given to you by your health care provider. Make sure you discuss any questions you have with your health care provider. Document Revised: 06/05/2021 Document Reviewed: 06/05/2021 Elsevier Patient Education  2022 [employer].    If you have been instructed to have an in-person evaluation today at a local Urgent Care facility, please use the link below. It will take you to a list of all of our available [facility], including address, phone number and hours of operation. Please do not delay care.  [facility]  If you or a family member do not have a primary care provider, use the link below to schedule a visit and establish care. When you choose a [facility] primary care physician or advanced practice provider, you gain a long-term partner in health. Find a Primary Care Provider  Learn more about [facility] in-office and virtual care options: [facility]

## 2021-12-02 NOTE — Progress Notes (Signed)
Virtual Visit Consent   Patricia Arnold, you are scheduled for a virtual visit with a [facility] provider today.     Just as with appointments in the office, your consent must be obtained to participate.  Your consent will be active for this visit and any virtual visit you may have with one of our providers in the next 365 days.     If you have a MyChart account, a copy of this consent can be sent to you electronically.  All virtual visits are billed to your insurance company just like a traditional visit in the office.    As this is a virtual visit, video technology does not allow for your provider to perform a traditional examination.  This may limit your provider's ability to fully assess your condition.  If your provider identifies any concerns that need to be evaluated in person or the need to arrange testing (such as labs, EKG, etc.), we will make arrangements to do so.     Although advances in technology are sophisticated, we cannot ensure that it will always work on either your end or our end.  If the connection with a video visit is poor, the visit may have to be switched to a telephone visit.  With either a video or telephone visit, we are not always able to ensure that we have a secure connection.     I need to obtain your verbal consent now.   Are you willing to proceed with your visit today?    Patricia Arnold has provided verbal consent on 12/02/2021 for a virtual visit (video or telephone).   Margaretann Loveless, PA-C   Date: 12/02/2021 11:46 AM   Virtual Visit via Video Note   I, Margaretann Loveless, connected with  Patricia Arnold  ([id number], Jun 09, 1976) on 12/02/21 at 11:45 AM EST by a video-enabled telemedicine application and verified that I am speaking with the correct person using two identifiers.  Location: Patient: Virtual Visit Location Patient: Home Provider: Virtual Visit Location Provider: Home Office   I discussed the limitations of evaluation  and management by telemedicine and the availability of in person appointments. The patient expressed understanding and agreed to proceed.    History of Present Illness: Patricia Arnold is a 46 y.o. who identifies as a female who was assigned female at birth, and is being seen today for Covid 70.  HPI: URI  This is a new problem. Episode onset: tested positive for Covid 19 this morning; symptoms started on yesterday. The problem has been gradually worsening. Associated symptoms include chest pain (burning), congestion, coughing, rhinorrhea, sinus pain and a sore throat. Pertinent negatives include no ear pain or plugged ear sensation. Associated symptoms comments: Subjective fevers (hot and cold spells), body aches. Treatments tried: BC, alka seltzer. The treatment provided no relief.     Problems:  Patient Active Problem List   Diagnosis Date Noted   Primary osteoarthritis of both hands 08/23/2017   Primary osteoarthritis of both feet 04/29/2017   Avascular necrosis left knee 03/12/2017   Polyarthralgia 02/18/2017   Uveitis 02/18/2017   Status post reconstruction of ligament of knee joint 02/18/2017   Sickle cell trait (HCC) 02/18/2017   History of asthma 02/18/2017   History of migraine 02/18/2017   Smoker 02/18/2017   Vitamin D deficiency 02/18/2017   Primary osteoarthritis of both knees 02/18/2017   Adjustment disorder with depressed mood 08/11/2014   Nausea/vomiting in pregnancy 06/21/2012   Bacterial vaginosis 06/21/2012  Viral gastroenteritis 06/21/2012    Allergies:  Allergies  Allergen Reactions   Namenda [Memantine Hcl] Other (See Comments)    Loss of vision, causes very bad bleeding/period   Penicillins Anaphylaxis   Sulfa Antibiotics Anaphylaxis   Tizanidine Anaphylaxis   Tramadol Anaphylaxis   Medications:  Current Outpatient Medications:    nirmatrelvir/ritonavir EUA (PAXLOVID) 20 x 150 MG & 10 x 100MG  TABS, Take 3 tablets by mouth 2 (two) times daily for 5  days. (Take nirmatrelvir 150 mg two tablets twice daily for 5 days and ritonavir 100 mg one tablet twice daily for 5 days) Patient GFR is greater than 60, Disp: 30 tablet, Rfl: 0   cyclobenzaprine (FLEXERIL) 10 MG tablet, Take 1 tablet (10 mg total) by mouth 2 (two) times daily as needed for muscle spasms., Disp: 20 tablet, Rfl: 0   norethindrone (MICRONOR) 0.35 MG tablet, Take 1 tablet (0.35 mg total) by mouth daily., Disp: 84 tablet, Rfl: 3   ondansetron (ZOFRAN) 4 MG tablet, Take 1 tablet (4 mg total) by mouth every 6 (six) hours., Disp: 12 tablet, Rfl: 0  Observations/Objective: Patient is well-developed, well-nourished in no acute distress.  Resting comfortably at home.  Head is normocephalic, atraumatic.  No labored breathing.  Speech is clear and coherent with logical content.  Patient is alert and oriented at baseline.    Assessment and Plan: 1. COVID-19 - nirmatrelvir/ritonavir EUA (PAXLOVID) 20 x 150 MG & 10 x 100MG  TABS; Take 3 tablets by mouth 2 (two) times daily for 5 days. (Take nirmatrelvir 150 mg two tablets twice daily for 5 days and ritonavir 100 mg one tablet twice daily for 5 days) Patient GFR is greater than 60  Dispense: 30 tablet; Refill: 0 - MyChart COVID-19 home monitoring program; Future  - Continue OTC symptomatic management of choice - Will send OTC vitamins and supplement information through AVS - Paxlovid prescribed - Patient enrolled in MyChart symptom monitoring - Push fluids - Rest as needed - Discussed return precautions and when to seek in-person evaluation, sent via AVS as well'   Follow Up Instructions: I discussed the assessment and treatment plan with the patient. The patient was provided an opportunity to ask questions and all were answered. The patient agreed with the plan and demonstrated an understanding of the instructions.  A copy of instructions were sent to the patient via MyChart unless otherwise noted below.    The patient was advised  to call back or seek an in-person evaluation if the symptoms worsen or if the condition fails to improve as anticipated.  Time:  I spent 12 minutes with the patient via telehealth technology discussing the above problems/concerns.    , PA-C

## 2021-12-04 ENCOUNTER — Encounter (INDEPENDENT_AMBULATORY_CARE_PROVIDER_SITE_OTHER): Payer: Self-pay

## 2021-12-04 ENCOUNTER — Telehealth: Payer: Self-pay | Admitting: *Deleted

## 2021-12-04 NOTE — Telephone Encounter (Signed)
I called pt in response to a Best Practice Advisory triggered on her Covid-19 Questionnaire indicating her appetite was worse.  Yesterday I ate 1/2 banana.  I don't have an appetite.   No vomiting, diarrhea or nausea.   I'm feeling better today.   My nose is no longer running.   I went over the [facility] Covid Treatment Plan with her for decreased appetite.  She verbalized understanding of the instructions.

## 2021-12-10 ENCOUNTER — Telehealth: Payer: Self-pay | Admitting: *Deleted

## 2021-12-10 NOTE — Telephone Encounter (Signed)
Called patient to review My Chart BPA questionnaire responses. No answer, LVMTCB . Will send My Chart message to give advise regarding worsening appetite and to notify her PCP for worsening symptoms.

## 2022-09-14 IMAGING — MG DIGITAL DIAGNOSTIC BILAT W/ TOMO W/ CAD
8 of 15 series · 8 of 40 positions shown · non-contrast
Comparison: Previous exam(s).

CLINICAL DATA: Patient returns after new baseline screening study
for evaluation of calcifications and possible masses in the RIGHT
breast and possible mass in the LEFT breast.

EXAM:
DIGITAL DIAGNOSTIC BILATERAL MAMMOGRAM WITH TOMOSYNTHESIS AND CAD;
ULTRASOUND LEFT BREAST LIMITED
TECHNIQUE: Bilateral digital diagnostic mammography and breast tomosynthesis
was performed. The images were evaluated with computer-aided
detection.; Targeted ultrasound examination of the left breast was
performed

[R CC]
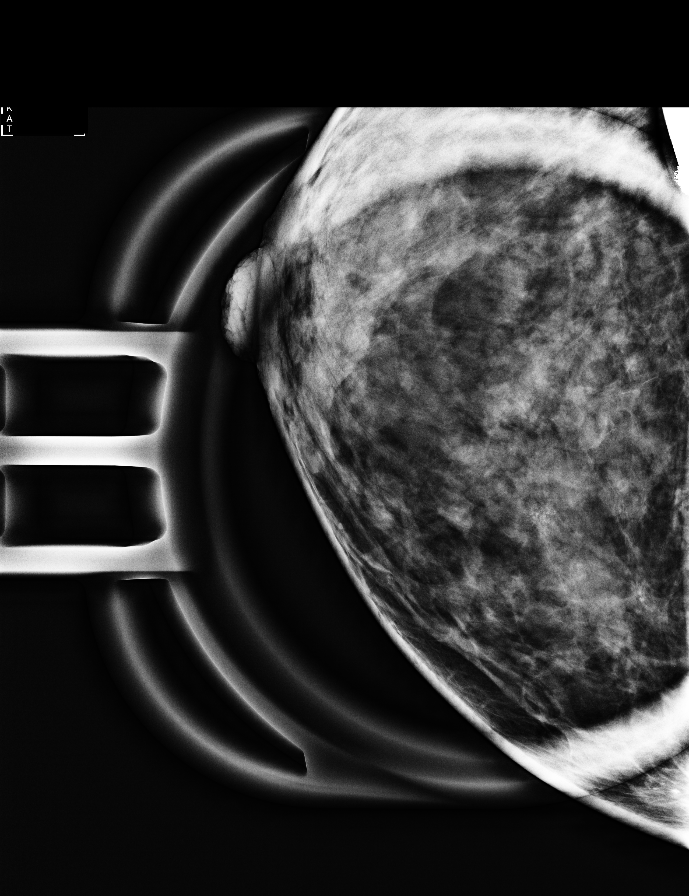

[R ML]
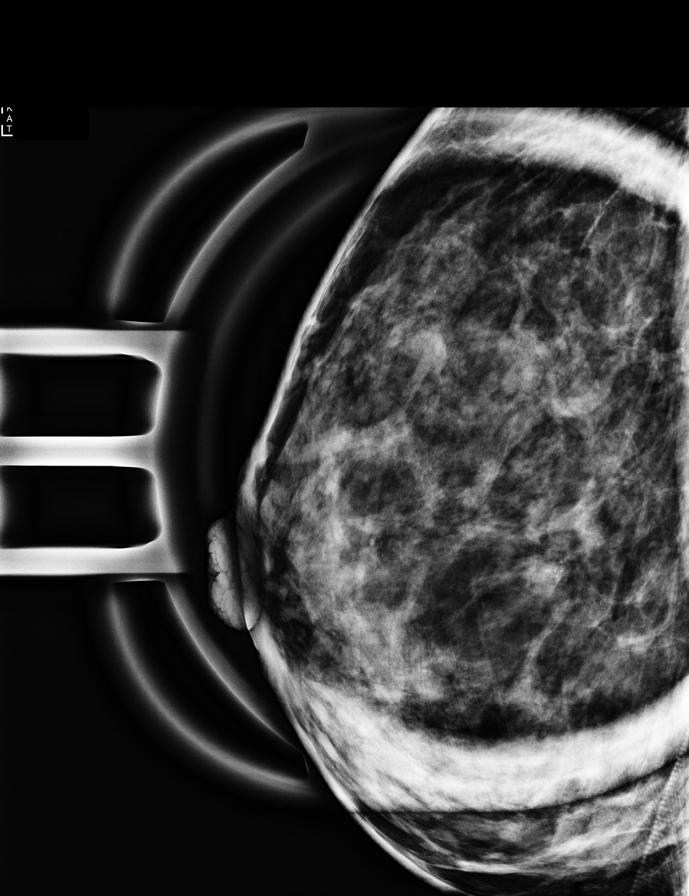

[R CC synth-2D]
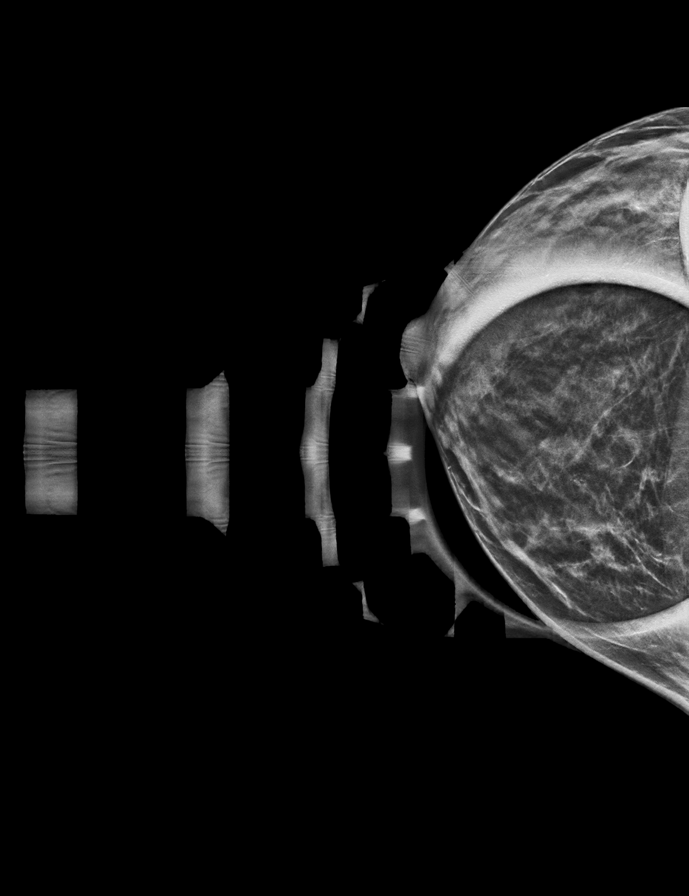

[R ML synth-2D]
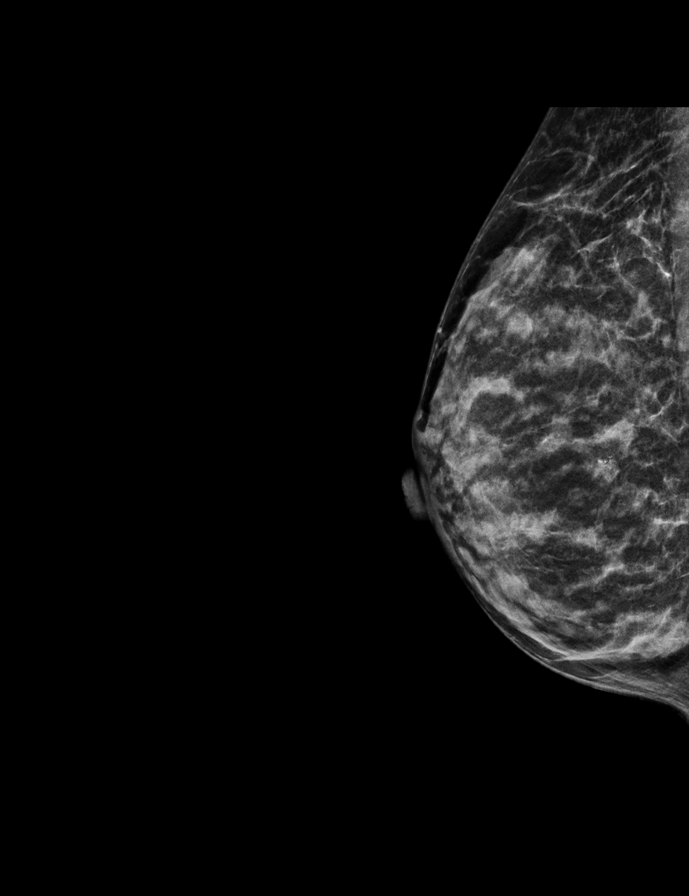

[L ML synth-2D]
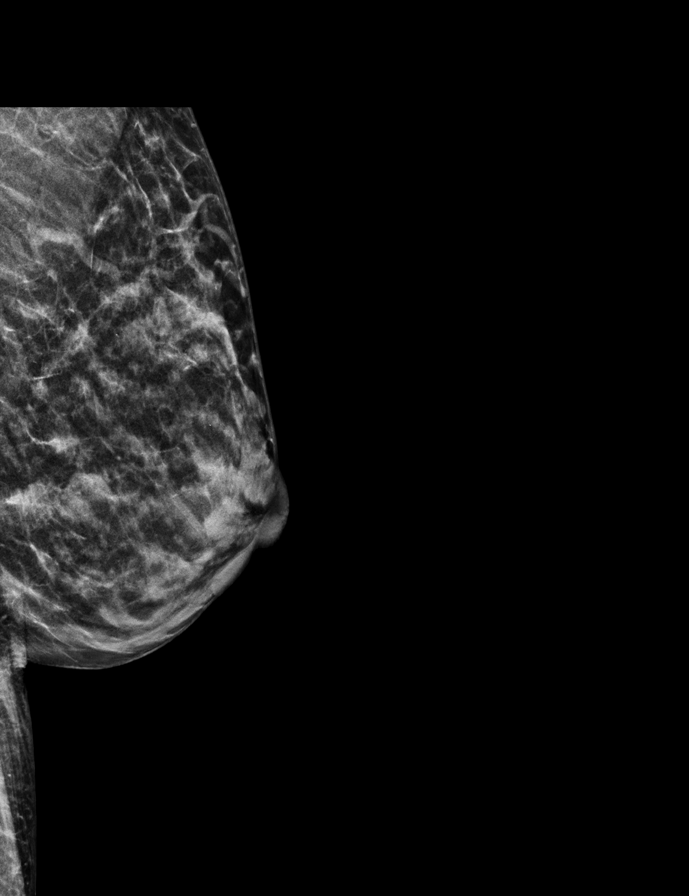

[R MLO synth-2D]
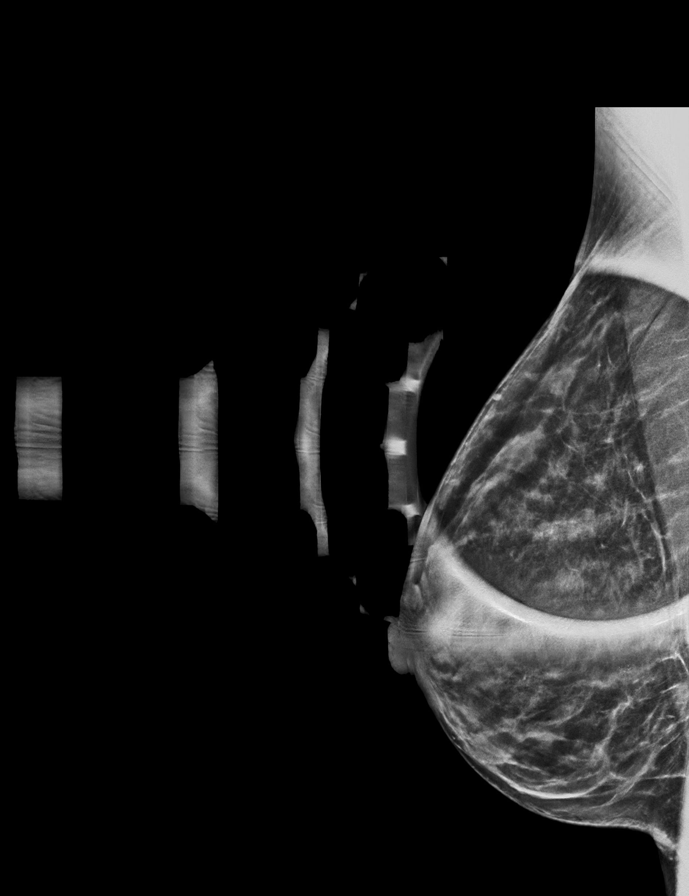

[L MLO synth-2D]
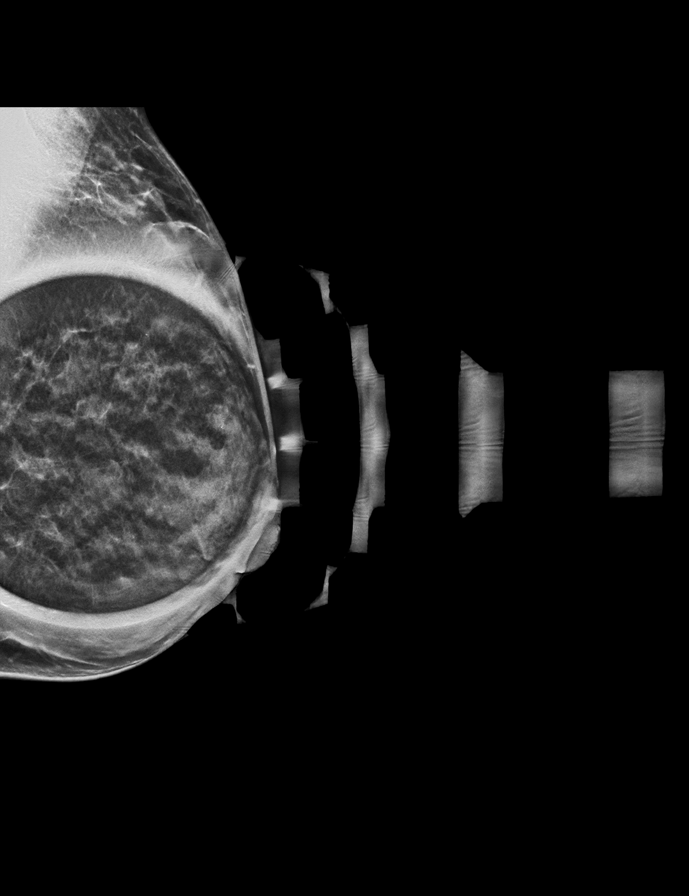

[R XCCL synth-2D]
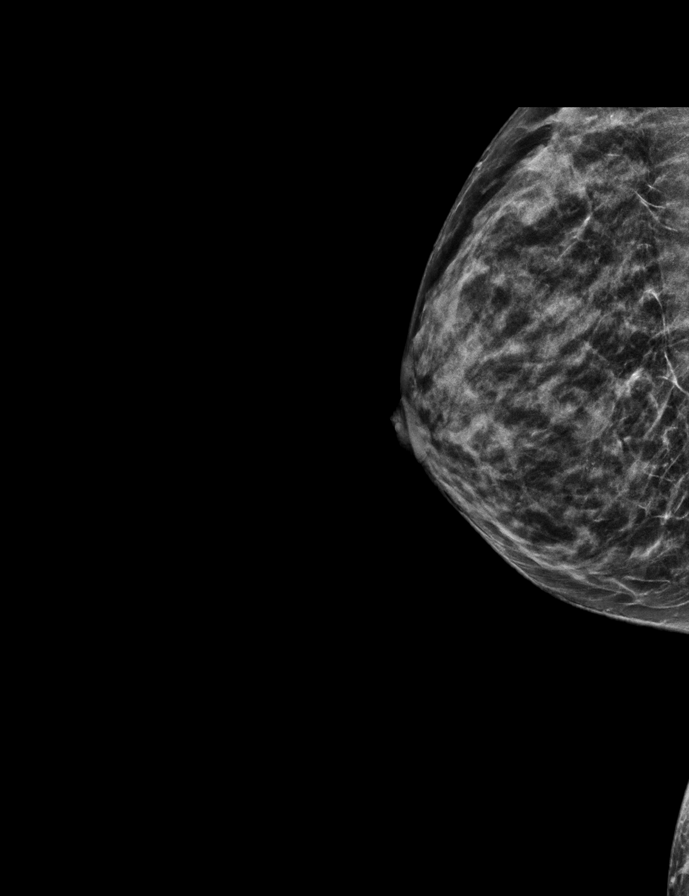

[8 of 40 positions shown; findings below may reference images not displayed]

ACR Breast Density Category c: The breast tissue is heterogeneously
dense, which may obscure small masses.
FINDINGS: RIGHT BREAST:

Mammogram: Additional 2-D and 3-D images are performed. These views
show no persistent mass in the RIGHT breast.

In the MEDIAL portion of the RIGHT breast, there is a group of faint
pleomorphic calcifications which span 0.4 x 0.4 x 0.5 centimeters.
Mammographic images were processed with CAD.

LEFT BREAST:

Mammogram: Additional 2-D and 3-D images are performed. These views
confirm presence of a partially obscured mass in the LATERAL portion
of the LEFT breast. Mammographic images were processed with CAD.

Ultrasound: Targeted ultrasound is performed, showing simple cyst in
the 4 o'clock location of the LEFT breast 2 centimeters from the
nipple which measures 1.0 x 1.3 x 0.7 centimeters. No solid mass or
areas of acoustic shadowing.
IMPRESSION: 1. Indeterminate 0.5 centimeter group of calcifications in the
MEDIAL portion of the RIGHT breast warranting tissue diagnosis.
2. No persistent masses in the RIGHT breast.
3. Benign cyst in the 4 o'clock location of the LEFT breast.

RECOMMENDATION:
Recommend stereotactic guided core biopsy of RIGHT breast
calcifications.

I have discussed the findings and recommendations with the patient.
If applicable, a reminder letter will be sent to the patient
regarding the next appointment.

BI-RADS CATEGORY  4: Suspicious.

## 2022-12-16 IMAGING — DX DG CHEST 1V PORT
1 series · 1 of 1 positions shown · non-contrast
Comparison: 03/08/2018.

CLINICAL DATA: Cough.  Chest pain.

EXAM:
PORTABLE CHEST 1 VIEW

[chest]
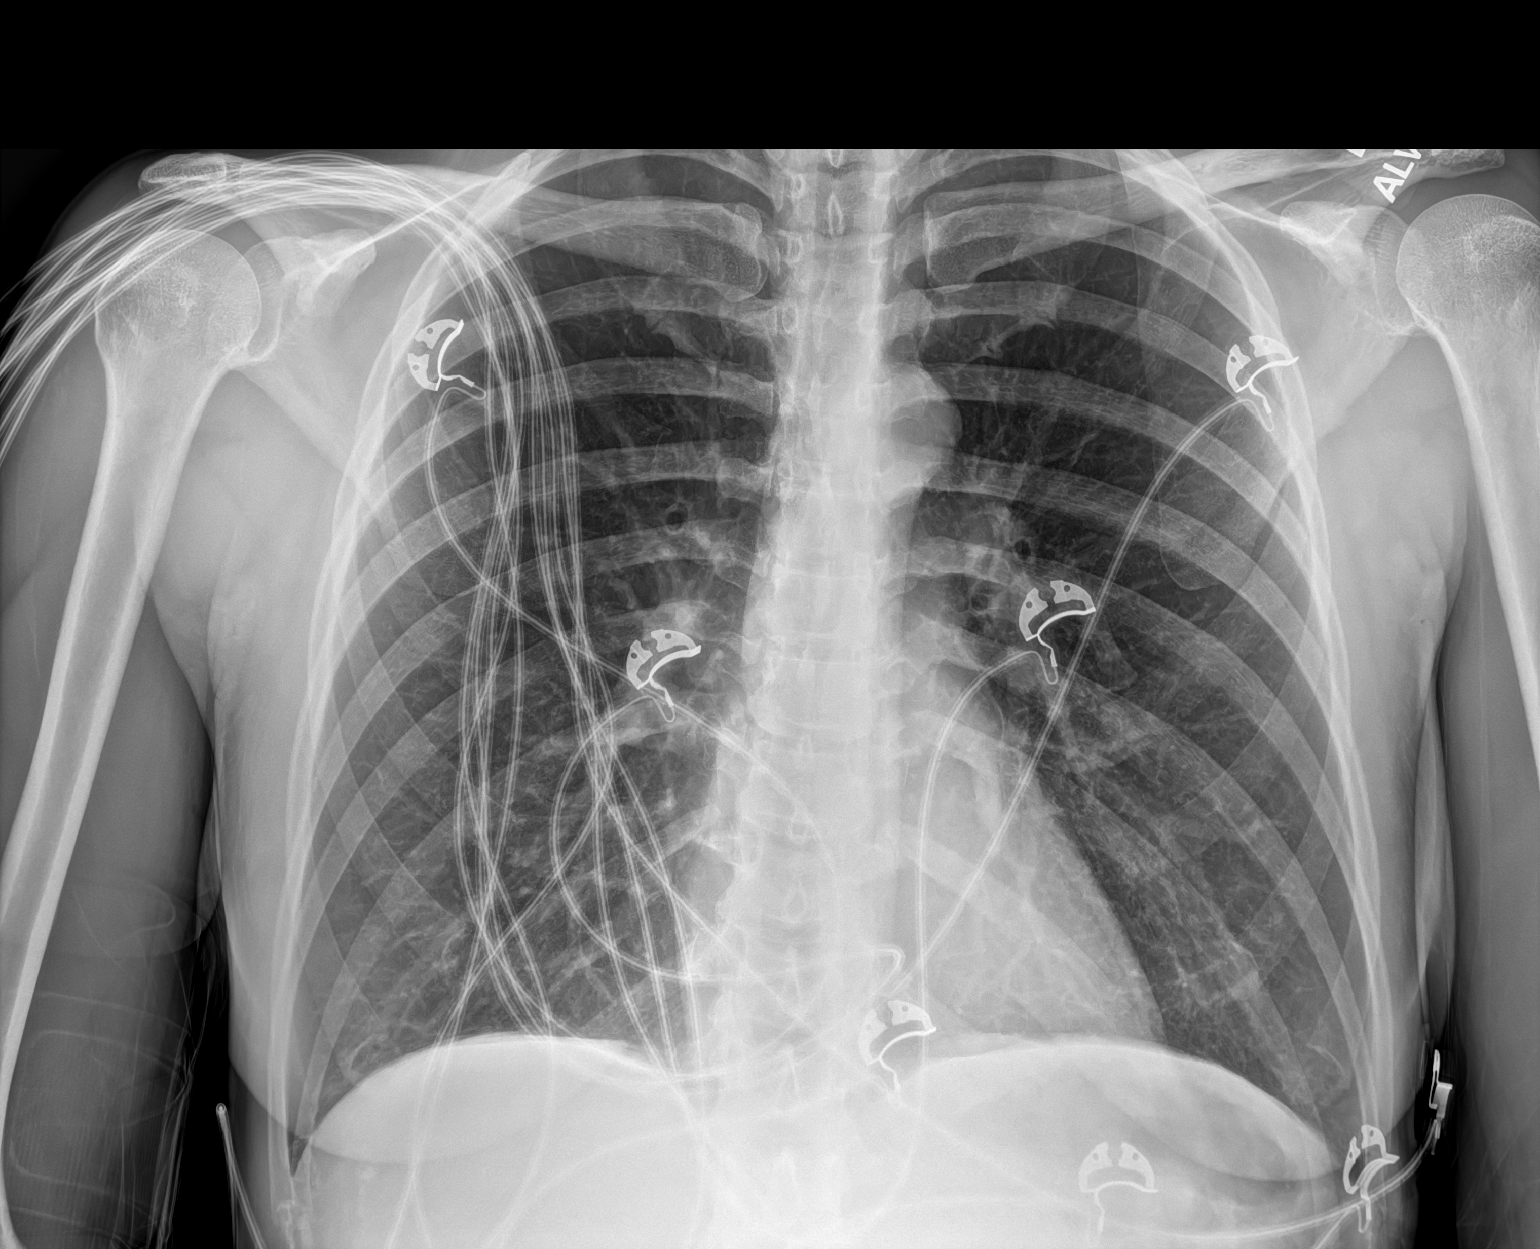

[1 of 1 positions shown; findings below may reference images not displayed]

FINDINGS: Mediastinum and hilar structures normal. Heart size normal.
Bilateral peribronchial cuffing noted suggesting bronchitis. No
focal infiltrate. No pleural effusion or pneumothorax. No acute bony
abnormality.
IMPRESSION: Bilateral peribronchial cuffing suggesting bronchitis. No focal
infiltrate.
